# Patient Record
Sex: Female | Born: 1985 | Race: Black or African American | Hispanic: No | Marital: Single | Smoking: Current every day smoker
Health system: Southern US, Community
[De-identification: ages and names within clinical notes are randomized; demographics above are authoritative.]

## PROBLEM LIST (undated history)

## (undated) DIAGNOSIS — A599 Trichomoniasis, unspecified: Secondary | ICD-10-CM

## (undated) DIAGNOSIS — I10 Essential (primary) hypertension: Secondary | ICD-10-CM

## (undated) DIAGNOSIS — I509 Heart failure, unspecified: Secondary | ICD-10-CM

## (undated) HISTORY — DX: Trichomoniasis, unspecified: A59.9

---

## 2005-02-09 ENCOUNTER — Emergency Department (HOSPITAL_COMMUNITY): Admission: EM | Admit: 2005-02-09 | Discharge: 2005-02-10 | Payer: Self-pay | Admitting: Emergency Medicine

## 2006-12-03 ENCOUNTER — Emergency Department (HOSPITAL_COMMUNITY): Admission: EM | Admit: 2006-12-03 | Discharge: 2006-12-03 | Payer: Self-pay | Admitting: Emergency Medicine

## 2009-01-29 ENCOUNTER — Emergency Department (HOSPITAL_COMMUNITY): Admission: EM | Admit: 2009-01-29 | Discharge: 2009-01-29 | Payer: Self-pay | Admitting: Emergency Medicine

## 2012-07-12 ENCOUNTER — Emergency Department (HOSPITAL_COMMUNITY)
Admission: EM | Admit: 2012-07-12 | Discharge: 2012-07-12 | Disposition: A | Discharge status: Home / Self Care | Payer: Self-pay | Attending: Emergency Medicine | Admitting: Emergency Medicine

## 2012-07-12 ENCOUNTER — Encounter (HOSPITAL_COMMUNITY): Payer: Self-pay | Admitting: *Deleted

## 2012-07-12 DIAGNOSIS — L02411 Cutaneous abscess of right axilla: Secondary | ICD-10-CM

## 2012-07-12 DIAGNOSIS — IMO0002 Reserved for concepts with insufficient information to code with codable children: Secondary | ICD-10-CM | POA: Insufficient documentation

## 2012-07-12 DIAGNOSIS — F17200 Nicotine dependence, unspecified, uncomplicated: Secondary | ICD-10-CM | POA: Insufficient documentation

## 2012-07-12 NOTE — ED Provider Notes (Signed)
Medical screening examination/treatment/procedure(s) were performed by non-physician practitioner and as supervising physician I was immediately available for consultation/collaboration.   Celene Kras, MD 07/12/12 [phone]

## 2012-07-12 NOTE — ED Provider Notes (Signed)
History     CSN: [id number]  Arrival date & time 07/12/12  0750   First MD Initiated Contact with Patient 07/12/12 [phone]      Chief Complaint  Patient presents with  . Abscess    (Consider location/radiation/quality/duration/timing/severity/associated sxs/prior treatment) Patient is a 26 y.o. female presenting with abscess. The history is provided by the patient.  Abscess  This is a new problem. The current episode started more than one week ago. The onset was gradual. The problem occurs continuously. The problem has been gradually worsening. Affected Location: R axilla. The problem is moderate. The abscess is characterized by painfulness and swelling. It is unknown what she was exposed to. Pertinent negatives include no fever.  No drainage from area. Hx of abscesses previously requiring drainage.  History reviewed. No pertinent past medical history.  History reviewed. No pertinent past surgical history.  No family history on file.  History  Substance Use Topics  . Smoking status: Current Everyday Smoker -- 0.5 packs/day  . Smokeless tobacco: Not on file  . Alcohol Use: No    OB History    Grav Para Term Preterm Abortions TAB SAB Ect Mult Living                  Review of Systems  Constitutional: Negative for fever and chills.  Musculoskeletal: Negative for myalgias.  Skin: Positive for wound.  Neurological: Negative for numbness.    Allergies  Review of patient's allergies indicates no known allergies.  Home Medications   Current Outpatient Rx  Name Route Sig Dispense Refill  . ASPIRIN 81 MG PO TABS Oral Take 81 mg by mouth daily as needed. pain      BP 170/95  Pulse 103  Temp 97 F (36.1 C) (Oral)  Resp 16  Wt 195 lb (88.451 kg)  SpO2 100%  LMP 07/12/2012  Physical Exam  Nursing note and vitals reviewed. Constitutional: She appears well-developed and well-nourished. No distress.  HENT:  Head: Normocephalic and atraumatic.  Neck: Normal range of  motion.  Cardiovascular: Normal rate.   Pulmonary/Chest: Effort normal.  Musculoskeletal: Normal range of motion.  Neurological: She is alert.  Skin: Skin is warm and dry. She is not diaphoretic.       ~4cm area of fluctuance c/w abscess to R axilla, no drainage or cellulitis, scar tissue present from previous abscesses  Psychiatric: She has a normal mood and affect.    ED Course  Procedures (including critical care time)  INCISION AND DRAINAGE Performed by: Grant Fontana Consent: Verbal consent obtained. Risks and benefits: risks, benefits and alternatives were discussed Type: abscess  Body area: r axilla  Anesthesia: local infiltration  Local anesthetic: lidocaine 2% with epinephrine  Anesthetic total: 5 ml  Complexity: complex Blunt dissection to break up loculations  Drainage: purulent  Drainage amount: copious  Packing material: 1/4 in iodoform gauze  Patient tolerance: Patient tolerated the procedure well with no immediate complications.     Labs Reviewed - No data to display No results found.   1. Abscess of right axilla       MDM  Pt presented with lg abscess to R axilla. No drainage noted. No fever. Nontoxic appearing. No overlying cellulitis. Area was incised and drained which she tolerated well. Packed. Due to size, advised that she return in 2 days for wound recheck. Reasons to return sooner discussed.        Grant Fontana, PA-C 07/12/12 [phone]

## 2012-07-12 NOTE — ED Notes (Signed)
pa at bedside. 

## 2014-12-19 ENCOUNTER — Emergency Department (HOSPITAL_COMMUNITY)
Admission: EM | Admit: 2014-12-19 | Discharge: 2014-12-19 | Disposition: A | Discharge status: Home / Self Care | Payer: Self-pay | Attending: Emergency Medicine | Admitting: Emergency Medicine

## 2014-12-19 ENCOUNTER — Encounter (HOSPITAL_COMMUNITY): Payer: Self-pay

## 2014-12-19 DIAGNOSIS — Z720 Tobacco use: Secondary | ICD-10-CM | POA: Insufficient documentation

## 2014-12-19 DIAGNOSIS — L02411 Cutaneous abscess of right axilla: Secondary | ICD-10-CM | POA: Insufficient documentation

## 2014-12-19 DIAGNOSIS — I10 Essential (primary) hypertension: Secondary | ICD-10-CM | POA: Insufficient documentation

## 2014-12-19 DIAGNOSIS — Z7982 Long term (current) use of aspirin: Secondary | ICD-10-CM | POA: Insufficient documentation

## 2014-12-19 HISTORY — DX: Essential (primary) hypertension: I10

## 2014-12-19 MED ORDER — LIDOCAINE HCL 2 % IJ SOLN
0.0000 mL | Freq: Once | INTRAMUSCULAR | Status: AC | PRN
  Administered 2014-12-19: 1 mg via INTRADERMAL
  Filled 2014-12-19: qty 20

## 2014-12-19 NOTE — ED Notes (Signed)
Attending at bedside.

## 2014-12-19 NOTE — ED Notes (Signed)
Large edematous area to right axilla, tender and hot to touch.

## 2014-12-19 NOTE — ED Provider Notes (Signed)
CSN: [id number]     Arrival date & time 12/19/14  0754 History   First MD Initiated Contact with Patient 12/19/14 0801     Chief Complaint  Patient presents with  . Abscess     (Consider location/radiation/quality/duration/timing/severity/associated sxs/prior Treatment) HPI  Robin Dudley is a 29 y.o. female with PMH of HTN presenting with abscess on her right arm for 1 week. Patient has been trying "[place] ease" gel without much relief. Patient denies any spontaneous drainage, redness. No numbness tingling. No nausea vomiting. No fevers or chills. Patient states she has had this many times before for years. Hasn't had one in the last 23 months. Patient also with history of hypertension on 4 medications. She states she was recently incarcerated and has 6 months worth of medications but no primary care provider. Pt states she has been taking her medications. She denies any headache, slurred speech, visual changes, weakness no hematuria or confusion. No history of DM.   Past Medical History  Diagnosis Date  . Hypertension    History reviewed. No pertinent past surgical history. History reviewed. No pertinent family history. History  Substance Use Topics  . Smoking status: Current Every Day Smoker -- 0.50 packs/day  . Smokeless tobacco: Not on file  . Alcohol Use: No   OB History    No data available     Review of Systems  Constitutional: Negative for fever and chills.  Gastrointestinal: Negative for nausea and vomiting.  Neurological: Negative for numbness and headaches.      Allergies  Review of patient's allergies indicates no known allergies.  Home Medications   Prior to Admission medications   Medication Sig Start Date End Date Taking? Authorizing Provider  aspirin 81 MG tablet Take 81 mg by mouth daily as needed. pain    Historical Provider, MD   BP 182/121 mmHg  Pulse 95  Temp(Src) 98.2 F (36.8 C) (Oral)  Resp 18  SpO2 99%  LMP 12/05/2014 Physical Exam   Constitutional: She appears well-developed and well-nourished. No distress.  HENT:  Head: Normocephalic and atraumatic.  Eyes: Conjunctivae and EOM are normal. Right eye exhibits no discharge. Left eye exhibits no discharge.  Cardiovascular: Normal rate and regular rhythm.   Pulmonary/Chest: No respiratory distress.  Neurological: She is alert. She exhibits normal muscle tone. Coordination normal.  Skin: Skin is warm and dry. She is not diaphoretic.  4 cm x 2 cm fluctuant abscess to right axilla with surrounding induration without surrounding erythema or evidence of cellulitis. No spontaneous discharge.  Nursing note and vitals reviewed.   ED Course  Procedures (including critical care time) Labs Review Labs Reviewed - No data to display  Imaging Review No results found.   EKG Interpretation None      INCISION AND DRAINAGE Performed by: Louann SjogrenVictoria L Normalee Sistare Consent: Verbal consent obtained. Risks and benefits: risks, benefits and alternatives were discussed Type: abscess  Body area: Right axilla  Anesthesia: local infiltration  Incision was made with a scalpel.  Local anesthetic: lidocaine 2% w/o epinephrine  Anesthetic total: 5 ml  Complexity: complex Blunt dissection to break up loculations  Drainage: purulent  Drainage amount: copious  Packing material: 1/2 in iodoform gauze  Patient tolerance: Patient tolerated the procedure well with no immediate complications.     MDM   Final diagnoses:  Abscess of axilla, right  Essential hypertension   Patient with skin abscess amenable to incision and drainage.  Pt tolerated procedure well without immediate complications. Abscess packed with gauze.  Encouraged home warm soaks and flushing.  Mild signs of cellulitis is surrounding skin.  Will d/c to home.  No antibiotic therapy is indicated.  Patient noted to be hypertensive in the emergency department.  No signs of hypertensive urgency.  Discussed with patient the  need for close follow-up and management by a primary care physician. Referral to wellness center. Pt taking HCTZ twice daily and cannot remember the other medications. Pt to take this medication 3 times daily. And take a dose when she gets home.  Discussed return precautions with patient. Discussed all results and patient verbalizes understanding and agrees with plan.     Louann Sjogren, PA-C 12/19/14 0845  Louann Sjogren, PA-C 12/19/14 1914  Mirian Mo, MD 12/20/14 [phone]

## 2014-12-19 NOTE — ED Notes (Signed)
Pt states boil under right arm.  Has hx of same.  Has had x 1 week.  Using heat at home with no relief.

## 2014-12-19 NOTE — Discharge Instructions (Signed)
Return to the emergency room with worsening of symptoms, new symptoms or with symptoms that are concerning, especially pain swelling, severe pain, red streaks from area, fevers, generally feel ill, OR severe worsening of headache, visual or speech changes, weakness in face, arms or legs. Warm soaks 3 times daily. Please read all below information and follow recommendations. Establish care with primary care provider for your hypertension.  Hypertension Hypertension, commonly called high blood pressure, is when the force of blood pumping through your arteries is too strong. Your arteries are the blood vessels that carry blood from your heart throughout your body. A blood pressure reading consists of a higher number over a lower number, such as 110/72. The higher number (systolic) is the pressure inside your arteries when your heart pumps. The lower number (diastolic) is the pressure inside your arteries when your heart relaxes. Ideally you want your blood pressure below 120/80. Hypertension forces your heart to work harder to pump blood. Your arteries may become narrow or stiff. Having hypertension puts you at risk for heart disease, stroke, and other problems.  RISK FACTORS Some risk factors for high blood pressure are controllable. Others are not.  Risk factors you cannot control include:   Race. You may be at higher risk if you are African American.  Age. Risk increases with age.  Gender. Men are at higher risk than women before age 31 years. After age 31, women are at higher risk than men. Risk factors you can control include:  Not getting enough exercise or physical activity.  Being overweight.  Getting too much fat, sugar, calories, or salt in your diet.  Drinking too much alcohol. SIGNS AND SYMPTOMS Hypertension does not usually cause signs or symptoms. Extremely high blood pressure (hypertensive crisis) may cause headache, anxiety, shortness of breath, and nosebleed. DIAGNOSIS  To  check if you have hypertension, your health care provider will measure your blood pressure while you are seated, with your arm held at the level of your heart. It should be measured at least twice using the same arm. Certain conditions can cause a difference in blood pressure between your right and left arms. A blood pressure reading that is higher than normal on one occasion does not mean that you need treatment. If one blood pressure reading is high, ask your health care provider about having it checked again. TREATMENT  Treating high blood pressure includes making lifestyle changes and possibly taking medicine. Living a healthy lifestyle can help lower high blood pressure. You may need to change some of your habits. Lifestyle changes may include:  Following the DASH diet. This diet is high in fruits, vegetables, and whole grains. It is low in salt, red meat, and added sugars.  Getting at least 2 hours of brisk physical activity every week.  Losing weight if necessary.  Not smoking.  Limiting alcoholic beverages.  Learning ways to reduce stress. If lifestyle changes are not enough to get your blood pressure under control, your health care provider may prescribe medicine. You may need to take more than one. Work closely with your health care provider to understand the risks and benefits. HOME CARE INSTRUCTIONS  Have your blood pressure rechecked as directed by your health care provider.   Take medicines only as directed by your health care provider. Follow the directions carefully. Blood pressure medicines must be taken as prescribed. The medicine does not work as well when you skip doses. Skipping doses also puts you at risk for problems.   Do  not smoke.   Monitor your blood pressure at home as directed by your health care provider. SEEK MEDICAL CARE IF:   You think you are having a reaction to medicines taken.  You have recurrent headaches or feel dizzy.  You have swelling in  your ankles.  You have trouble with your vision. SEEK IMMEDIATE MEDICAL CARE IF:  You develop a severe headache or confusion.  You have unusual weakness, numbness, or feel faint.  You have severe chest or abdominal pain.  You vomit repeatedly.  You have trouble breathing. MAKE SURE YOU:   Understand these instructions.  Will watch your condition.  Will get help right away if you are not doing well or get worse. Document Released: 11/28/2005 Document Revised: 04/14/2014 Document Reviewed: 09/20/2013 [facility] Patient Information 2015 [place], [place]. This information is not intended to replace advice given to you by your health care provider. Make sure you discuss any questions you have with your health care provider.    Abscess Care After An abscess (also called a boil or furuncle) is an infected area that contains a collection of pus. Signs and symptoms of an abscess include pain, tenderness, redness, or hardness, or you may feel a moveable soft area under your skin. An abscess can occur anywhere in the body. The infection may spread to surrounding tissues causing cellulitis. A cut (incision) by the surgeon was made over your abscess and the pus was drained out. Gauze may have been packed into the space to provide a drain that will allow the cavity to heal from the inside outwards. The boil may be painful for 5 to 7 days. Most people with a boil do not have high fevers. Your abscess, if seen early, may not have localized, and may not have been lanced. If not, another appointment may be required for this if it does not get better on its own or with medications. HOME CARE INSTRUCTIONS   Only take over-the-counter or prescription medicines for pain, discomfort, or fever as directed by your caregiver.  When you bathe, soak and then remove gauze or iodoform packs at least daily or as directed by your caregiver. You may then wash the wound gently with mild soapy water. Repack with gauze or  do as your caregiver directs. SEEK IMMEDIATE MEDICAL CARE IF:   You develop increased pain, swelling, redness, drainage, or bleeding in the wound site.  You develop signs of generalized infection including muscle aches, chills, fever, or a general ill feeling.  An oral temperature above 102 F (38.9 C) develops, not controlled by medication. See your caregiver for a recheck if you develop any of the symptoms described above. If medications (antibiotics) were prescribed, take them as directed. Document Released: 06/16/2005 Document Revised: 02/20/2012 Document Reviewed: 02/11/2008 [facility] Patient Information 2015 [place], [place]. This information is not intended to replace advice given to you by your health care provider. Make sure you discuss any questions you have with your health care provider.

## 2015-07-30 ENCOUNTER — Encounter (HOSPITAL_COMMUNITY): Payer: Self-pay | Admitting: Emergency Medicine

## 2015-07-30 ENCOUNTER — Emergency Department (HOSPITAL_COMMUNITY)
Admission: EM | Admit: 2015-07-30 | Discharge: 2015-07-30 | Disposition: A | Discharge status: Other Acute Inpatient Hospital | Payer: Self-pay | Attending: Emergency Medicine | Admitting: Emergency Medicine

## 2015-07-30 ENCOUNTER — Inpatient Hospital Stay (HOSPITAL_COMMUNITY)
Admission: AD | Admit: 2015-07-30 | Discharge: 2015-07-30 | Disposition: A | Discharge status: Home / Self Care | Payer: Self-pay | Source: Ambulatory Visit | Attending: Family Medicine | Admitting: Family Medicine

## 2015-07-30 ENCOUNTER — Emergency Department (HOSPITAL_COMMUNITY): Payer: Self-pay

## 2015-07-30 ENCOUNTER — Encounter (HOSPITAL_COMMUNITY): Payer: Self-pay | Admitting: *Deleted

## 2015-07-30 DIAGNOSIS — N739 Female pelvic inflammatory disease, unspecified: Secondary | ICD-10-CM | POA: Insufficient documentation

## 2015-07-30 DIAGNOSIS — R102 Pelvic and perineal pain: Secondary | ICD-10-CM

## 2015-07-30 DIAGNOSIS — I10 Essential (primary) hypertension: Secondary | ICD-10-CM | POA: Insufficient documentation

## 2015-07-30 DIAGNOSIS — N888 Other specified noninflammatory disorders of cervix uteri: Secondary | ICD-10-CM | POA: Insufficient documentation

## 2015-07-30 DIAGNOSIS — F17210 Nicotine dependence, cigarettes, uncomplicated: Secondary | ICD-10-CM | POA: Insufficient documentation

## 2015-07-30 DIAGNOSIS — Z720 Tobacco use: Secondary | ICD-10-CM | POA: Insufficient documentation

## 2015-07-30 DIAGNOSIS — N730 Acute parametritis and pelvic cellulitis: Secondary | ICD-10-CM

## 2015-07-30 DIAGNOSIS — Z3202 Encounter for pregnancy test, result negative: Secondary | ICD-10-CM | POA: Insufficient documentation

## 2015-07-30 LAB — COMPREHENSIVE METABOLIC PANEL
ALBUMIN: 4.1 g/dL (ref 3.5–5.0)
ALK PHOS: 95 U/L (ref 38–126)
ALT: 20 U/L (ref 14–54)
AST: 19 U/L (ref 15–41)
Anion gap: 9 (ref 5–15)
BILIRUBIN TOTAL: 0.3 mg/dL (ref 0.3–1.2)
BUN: 17 mg/dL (ref 6–20)
CALCIUM: 9.2 mg/dL (ref 8.9–10.3)
CO2: 28 mmol/L (ref 22–32)
Chloride: 104 mmol/L (ref 101–111)
Creatinine, Ser: 0.92 mg/dL (ref 0.44–1.00)
GFR calc Af Amer: >60 mL/min (ref >60)
GFR calc non Af Amer: >60 mL/min (ref >60)
GLUCOSE: 106 mg/dL — AB (ref 65–99)
POTASSIUM: 3.4 mmol/L — AB (ref 3.5–5.1)
SODIUM: 141 mmol/L (ref 135–145)
TOTAL PROTEIN: 7.5 g/dL (ref 6.5–8.1)

## 2015-07-30 LAB — CBC WITH DIFFERENTIAL/PLATELET
BASOS ABS: 0 10*3/uL (ref 0.0–0.1)
Basophils Relative: 0 % (ref 0–1)
Eosinophils Absolute: 0.3 10*3/uL (ref 0.0–0.7)
Eosinophils Relative: 2 % (ref 0–5)
HEMATOCRIT: 37.2 % (ref 36.0–46.0)
HEMOGLOBIN: 12.4 g/dL (ref 12.0–15.0)
LYMPHS PCT: 17 % (ref 12–46)
Lymphs Abs: 2.6 10*3/uL (ref 0.7–4.0)
MCH: 27.6 pg (ref 26.0–34.0)
MCHC: 33.3 g/dL (ref 30.0–36.0)
MCV: 82.7 fL (ref 78.0–100.0)
MONOS PCT: 5 % (ref 3–12)
Monocytes Absolute: 0.8 10*3/uL (ref 0.1–1.0)
NEUTROS PCT: 76 % (ref 43–77)
Neutro Abs: 11.3 10*3/uL — ABNORMAL HIGH (ref 1.7–7.7)
Platelets: 234 10*3/uL (ref 150–400)
RBC: 4.5 MIL/uL (ref 3.87–5.11)
RDW: 15.7 % — ABNORMAL HIGH (ref 11.5–15.5)
WBC: 15 10*3/uL — AB (ref 4.0–10.5)

## 2015-07-30 LAB — URINE MICROSCOPIC-ADD ON

## 2015-07-30 LAB — URINALYSIS, ROUTINE W REFLEX MICROSCOPIC
Bilirubin Urine: NEGATIVE
Bilirubin Urine: NEGATIVE
GLUCOSE, UA: NEGATIVE mg/dL
Glucose, UA: NEGATIVE mg/dL
KETONES UR: NEGATIVE mg/dL
Ketones, ur: NEGATIVE mg/dL
LEUKOCYTES UA: NEGATIVE
NITRITE: NEGATIVE
Nitrite: NEGATIVE
PH: 6.5 (ref 5.0–8.0)
PROTEIN: NEGATIVE mg/dL
Protein, ur: NEGATIVE mg/dL
SPECIFIC GRAVITY, URINE: 1.023 (ref 1.005–1.030)
SPECIFIC GRAVITY, URINE: 1.015 (ref 1.005–1.030)
UROBILINOGEN UA: 0.2 mg/dL (ref 0.0–1.0)
Urobilinogen, UA: 0.2 mg/dL (ref 0.0–1.0)
pH: 6.5 (ref 5.0–8.0)

## 2015-07-30 LAB — HIV ANTIBODY (ROUTINE TESTING W REFLEX): HIV Screen 4th Generation wRfx: NONREACTIVE

## 2015-07-30 LAB — WET PREP, GENITAL
CLUE CELLS WET PREP: NONE SEEN
TRICH WET PREP: NONE SEEN
WBC WET PREP: NONE SEEN
Yeast Wet Prep HPF POC: NONE SEEN

## 2015-07-30 LAB — PREGNANCY, URINE: Preg Test, Ur: NEGATIVE

## 2015-07-30 MED ORDER — IBUPROFEN 600 MG PO TABS: 600.0000 mg | ORAL_TABLET | Freq: Four times a day (QID) | ORAL | Status: DC | PRN

## 2015-07-30 MED ORDER — LISINOPRIL 10 MG PO TABS
10.0000 mg | ORAL_TABLET | Freq: Once | ORAL | Status: AC
  Administered 2015-07-30: 10 mg via ORAL
  Filled 2015-07-30: qty 1

## 2015-07-30 MED ORDER — BENZOCAINE 20 % MT SOLN
Freq: Once | OROMUCOSAL | Status: DC | PRN
  Filled 2015-07-30 (×2): qty 57

## 2015-07-30 MED ORDER — HYDROCODONE-ACETAMINOPHEN 5-325 MG PO TABS: 1.0000 | ORAL_TABLET | ORAL | Status: DC | PRN

## 2015-07-30 MED ORDER — HYDROCODONE-ACETAMINOPHEN 5-325 MG PO TABS
1.0000 | ORAL_TABLET | Freq: Once | ORAL | Status: AC
  Administered 2015-07-30: 1 via ORAL
  Filled 2015-07-30: qty 1

## 2015-07-30 MED ORDER — OXYCODONE-ACETAMINOPHEN 5-325 MG PO TABS: 1.0000 | ORAL_TABLET | Freq: Four times a day (QID) | ORAL | Status: DC | PRN

## 2015-07-30 MED ORDER — ONDANSETRON 8 MG PO TBDP
8.0000 mg | ORAL_TABLET | Freq: Once | ORAL | Status: AC
  Administered 2015-07-30: 8 mg via ORAL
  Filled 2015-07-30: qty 1

## 2015-07-30 MED ORDER — HYDROMORPHONE HCL 2 MG/ML IJ SOLN
2.0000 mg | Freq: Once | INTRAMUSCULAR | Status: AC
  Administered 2015-07-30: 2 mg via INTRAVENOUS
  Filled 2015-07-30: qty 1

## 2015-07-30 MED ORDER — HYDROCODONE-ACETAMINOPHEN 5-325 MG PO TABS
1.0000 | ORAL_TABLET | Freq: Once | ORAL | Status: AC
Start: 2015-07-30 — End: 2015-07-30
  Administered 2015-07-30: 1 via ORAL
  Filled 2015-07-30: qty 1

## 2015-07-30 MED ORDER — CEFTRIAXONE SODIUM 250 MG IJ SOLR
250.0000 mg | Freq: Once | INTRAMUSCULAR | Status: AC
  Administered 2015-07-30: 250 mg via INTRAMUSCULAR
  Filled 2015-07-30: qty 250

## 2015-07-30 MED ORDER — LISINOPRIL 10 MG PO TABS: 10.0000 mg | ORAL_TABLET | Freq: Every day | ORAL | Status: DC

## 2015-07-30 MED ORDER — IOHEXOL 300 MG/ML  SOLN
100.0000 mL | Freq: Once | INTRAMUSCULAR | Status: AC | PRN
  Administered 2015-07-30: 100 mL via INTRAVENOUS

## 2015-07-30 MED ORDER — IOHEXOL 300 MG/ML  SOLN
50.0000 mL | Freq: Once | INTRAMUSCULAR | Status: AC | PRN
  Administered 2015-07-30: 50 mL via ORAL

## 2015-07-30 MED ORDER — PROMETHAZINE HCL 25 MG PO TABS: 25.0000 mg | ORAL_TABLET | Freq: Four times a day (QID) | ORAL | Status: DC | PRN

## 2015-07-30 MED ORDER — AZITHROMYCIN 250 MG PO TABS
1000.0000 mg | ORAL_TABLET | Freq: Once | ORAL | Status: AC
Start: 2015-07-30 — End: 2015-07-30
  Administered 2015-07-30: 1000 mg via ORAL
  Filled 2015-07-30: qty 4

## 2015-07-30 MED ORDER — AZITHROMYCIN 500 MG PO TABS: 1000.0000 mg | ORAL_TABLET | Freq: Once | ORAL | Status: DC

## 2015-07-30 NOTE — ED Provider Notes (Signed)
CSN: [id number]     Arrival date & time 07/30/15  0243 History  This chart was scribed for Loren Racer, MD by Doreatha Martin, ED Scribe. This patient was seen in room WA16/WA16 and the patient's care was started at 4:02 AM.     Chief Complaint  Patient presents with  . Vaginal Pain  . Back Pain   The history is provided by the patient. No language interpreter was used.   HPI Comments: Robin Dudley is a 29 y.o. female with Hx of HTN who presents to the Emergency Department complaining of moderate, gradual onset vaginal pain onset this afternoon. She states associated BLE pain, constant lower back pain that wraps around to the lower abdomen. She states that her pain was temporarily relieved by a warm bath. No Hx of similar Sx. LNMP 8/11-8/14. She states her periods have been regular. She is not followed by an OB/GYN. She denies any known trauma or injury with the onset of her symptoms. She also denies vaginal bleeding, vaginal discharge, nausea, vomiting, fever or chills.    Past Medical History  Diagnosis Date  . Hypertension    History reviewed. No pertinent past surgical history. No family history on file. Social History  Substance Use Topics  . Smoking status: Current Every Day Smoker -- 0.50 packs/day    Types: Cigarettes  . Smokeless tobacco: None  . Alcohol Use: No   OB History    No data available     Review of Systems  Constitutional: Negative for fever and chills.  Respiratory: Negative for shortness of breath.   Cardiovascular: Negative for chest pain.  Gastrointestinal: Positive for abdominal pain. Negative for nausea, vomiting, diarrhea and constipation.  Genitourinary: Positive for pelvic pain. Negative for dysuria, flank pain, vaginal bleeding, vaginal discharge and difficulty urinating.  Musculoskeletal: Positive for back pain. Negative for myalgias, neck pain and neck stiffness.  Neurological: Negative for dizziness, weakness, light-headedness, numbness and  headaches.  All other systems reviewed and are negative.   Allergies  Review of patient's allergies indicates no known allergies.  Home Medications   Prior to Admission medications   Medication Sig Start Date End Date Taking? Authorizing Provider  aspirin-acetaminophen-caffeine (EXCEDRIN MIGRAINE) [phone] MG per tablet Take 2 tablets by mouth every 6 (six) hours as needed for headache.   Yes Historical Provider, MD  HYDROcodone-acetaminophen (NORCO) 5-325 MG per tablet Take 1-2 tablets by mouth every 4 (four) hours as needed. 07/30/15   Loren Racer, MD  ibuprofen (ADVIL,MOTRIN) 600 MG tablet Take 1 tablet (600 mg total) by mouth every 6 (six) hours as needed. 07/30/15   Loren Racer, MD   BP 176/101 mmHg  Pulse 100  Temp(Src) 99.8 F (37.7 C) (Oral)  Resp 16  Ht  (1.651 m)  Wt 220 lb (99.791 kg)  BMI 36.61 kg/m2  SpO2 98%  LMP 07/25/2015 Physical Exam  Constitutional: She is oriented to person, place, and time. She appears well-developed and well-nourished. No distress.  HENT:  Head: Normocephalic and atraumatic.  Mouth/Throat: Oropharynx is clear and moist.  Eyes: EOM are normal. Pupils are equal, round, and reactive to light.  Neck: Normal range of motion. Neck supple.  Cardiovascular: Normal rate and regular rhythm.   Pulmonary/Chest: Effort normal and breath sounds normal. No respiratory distress. She has no wheezes. She has no rales. She exhibits no tenderness.  Abdominal: Soft. Bowel sounds are normal. She exhibits no mass. There is tenderness (Right pelvic tenderness with palpation.). There is no rebound and  no guarding.  Genitourinary:  The cervical motion tenderness the speculum exam is painful. Right greater than left adnexal and lower quadrant tenderness. Questionable anterior vaginal mass which is very tender to palpation.  Musculoskeletal: Normal range of motion. She exhibits tenderness. She exhibits no edema.  Patient has right-sided low back  tenderness with palpation. No midline tenderness. No definite CVA tenderness bilaterally.  Neurological: She is alert and oriented to person, place, and time.  Skin: Skin is warm and dry. No rash noted. No erythema.  Psychiatric: She has a normal mood and affect. Her behavior is normal.  Nursing note and vitals reviewed.   ED Course  Procedures (including critical care time) DIAGNOSTIC STUDIES: Oxygen Saturation is 100% on RA, normal by my interpretation.    COORDINATION OF CARE: 4:06 AM Discussed treatment plan with pt at bedside and pt agreed to plan.   Labs Review Labs Reviewed  CBC WITH DIFFERENTIAL/PLATELET - Abnormal; Notable for the following:    WBC 15.0 (*)    RDW 15.7 (*)    Neutro Abs 11.3 (*)    All other components within normal limits  COMPREHENSIVE METABOLIC PANEL - Abnormal; Notable for the following:    Potassium 3.4 (*)    Glucose, Bld 106 (*)    All other components within normal limits  URINALYSIS, ROUTINE W REFLEX MICROSCOPIC (NOT AT [facility]) - Abnormal; Notable for the following:    Hgb urine dipstick MODERATE (*)    Leukocytes, UA TRACE (*)    All other components within normal limits  WET PREP, GENITAL  PREGNANCY, URINE  URINE MICROSCOPIC-ADD ON  HIV ANTIBODY (ROUTINE TESTING)  GC/CHLAMYDIA PROBE AMP ([place]) NOT AT [facility]    Imaging Review US Pelvis Complete  07/30/2015   CLINICAL DATA:  Right adnexal tenderness  EXAM: TRANSABDOMINAL ULTRASOUND OF PELVIS  TECHNIQUE: Transabdominal ultrasound examination of the pelvis was performed including evaluation of the uterus, ovaries, adnexal regions, and pelvic cul-de-sac.  COMPARISON:  None.  FINDINGS: Uterus  Measurements: 9 x 3 x 5 cm. No fibroids or other mass visualized.  Endometrium  Not well delineated transabdominally; patient could not tolerate transvaginal evaluation. No focal abnormality visualized.  Right ovary  Measurements: 34 x 22 x 22 mm. Normal appearance/no adnexal mass.  Left ovary   Measurements: 33 x 22 x 26 mm. Normal appearance/no adnexal mass.  Other findings:  No free fluid  IMPRESSION: Normal transabdominal pelvic ultrasound.   Electronically Signed   By: Marnee Spring M.D.   On: 07/30/2015 05:42   I have personally reviewed and evaluated these images and lab results as part of my medical decision-making.   EKG Interpretation None      MDM   Final diagnoses:  Cyst, cervix    I personally performed the services described in this documentation, which was scribed in my presence. The recorded information has been reviewed and is accurate.  Reviewed findings with OB/GYN on call and recommended transfer to [facility] for further evaluation. Relayed to patient which she will transport by private vehicle.  Loren Racer, MD 07/30/15 [phone]

## 2015-07-30 NOTE — Progress Notes (Signed)
Consent explained by Dorathy Kinsman CNM, time out performed, consent signed.

## 2015-07-30 NOTE — Discharge Instructions (Signed)
Go to the MAU at [facility] for further evaluation of your cervical cyst.

## 2015-07-30 NOTE — MAU Note (Signed)
Pt sent from [facility] with dx of ovarian cyst for further eval.

## 2015-07-30 NOTE — Discharge Instructions (Signed)
Pelvic Inflammatory Disease °Pelvic inflammatory disease (PID) refers to an infection in some or all of the female organs. The infection can be in the uterus, ovaries, fallopian tubes, or the surrounding tissues in the pelvis. PID can cause abdominal or pelvic pain that comes on suddenly (acute pelvic pain). PID is a serious infection because it can lead to lasting (chronic) pelvic pain or the inability to have children (infertile).  °CAUSES  °The infection is often caused by the normal bacteria found in the vaginal tissues. PID may also be caused by an infection that is spread during sexual contact. PID can also occur following:  °· The birth of a baby.   °· A miscarriage.   °· An abortion.   °· Major pelvic surgery.   °· The use of an intrauterine device (IUD).   °· A sexual assault.   °RISK FACTORS °Certain factors can put a person at higher risk for PID, such as: °· Being younger than 25 years. °· Being sexually active at a young age. °· Using nonbarrier contraception. °· Having multiple sexual partners. °· Having sex with someone who has symptoms of a genital infection. °· Using oral contraception. °Other times, certain behaviors can increase the possibility of getting PID, such as: °· Having sex during your period. °· Using a vaginal douche. °· Having an intrauterine device (IUD) in place. °SYMPTOMS  °· Abdominal or pelvic pain.   °· Fever.   °· Chills.   °· Abnormal vaginal discharge. °· Abnormal uterine bleeding.   °· Unusual pain shortly after finishing your period. °DIAGNOSIS  °Your caregiver will choose some of the following methods to make a diagnosis, such as:  °· Performing a physical exam and history. A pelvic exam typically reveals a very tender uterus and surrounding pelvis.   °· Ordering laboratory tests including a pregnancy test, blood tests, and urine test.  °· Ordering cultures of the vagina and cervix to check for a sexually transmitted infection (STI). °· Performing an ultrasound.    °· Performing a laparoscopic procedure to look inside the pelvis.   °TREATMENT  °· Antibiotic medicines may be prescribed and taken by mouth.   °· Sexual partners may be treated when the infection is caused by a sexually transmitted disease (STD).   °· Hospitalization may be needed to give antibiotics intravenously. °· Surgery may be needed, but this is rare. °It may take weeks until you are completely well. If you are diagnosed with PID, you should also be checked for human immunodeficiency virus (HIV).   °HOME CARE INSTRUCTIONS  °· If given, take your antibiotics as directed. Finish the medicine even if you start to feel better.   °· Only take over-the-counter or prescription medicines for pain, discomfort, or fever as directed by your caregiver.   °· Do not have sexual intercourse until treatment is completed or as directed by your caregiver. If PID is confirmed, your recent sexual partner(s) will need treatment.   °· Keep your follow-up appointments. °SEEK MEDICAL CARE IF:  °· You have increased or abnormal vaginal discharge.   °· You need prescription medicine for your pain.   °· You vomit.   °· You cannot take your medicines.   °· Your partner has an STD.   °SEEK IMMEDIATE MEDICAL CARE IF:  °· You have a fever.   °· You have increased abdominal or pelvic pain.   °· You have chills.   °· You have pain when you urinate.   °· You are not better after 72 hours following treatment.   °MAKE SURE YOU:  °· Understand these instructions. °· Will watch your condition. °· Will get help right away if you are not doing well or get worse. °  Document Released: 11/28/2005 Document Revised: 03/25/2013 Document Reviewed: 11/24/2011 °ExitCare® Patient Information ©2015 [employer]. This information is not intended to replace advice given to you by your health care provider. Make sure you discuss any questions you have with your health care provider. ° °

## 2015-07-30 NOTE — ED Notes (Signed)
Pt c/o vaginal pain, lower back pain and lower abdominal pain x1 hour. Denies urinary frequency/discharge/burning with urination.

## 2015-07-30 NOTE — MAU Provider Note (Signed)
Chief Complaint: Ovarian Cyst   First Provider Initiated Contact with Patient 07/30/15 1032     SUBJECTIVE HPI: Robin Dudley is a 29 y.o. G1P0010 non-pregnant who was transferred to [facility] from [facility] ED for further eval and management of pelvic pain w/ leukocytosis and possible 2.5 cm Nabothian cyst per CT. CT otherwise normal. Pelvic US normal, but pt could not tolerate TV US. No cystic structures noted on [place] report. Wet, Prep, CBC, CMET essentially normal except WBC 15. UA mod Hgb, Tr leaks. Will send culture. Pt given Norco at ED w/ minimal relief.   Location: pelvic/vaginal Quality: pressure Severity: 10/10 on pain scale Duration: 24 hours Context: none Timing: constant Modifying factors: worse w/ mvmt. Minimal improvement w/ Norco. Associated signs and symptoms: Neg for fever, chills, N/V/D/C or urinary complaints.    No past medical history on file. OB History  Gravida Para Term Preterm AB SAB TAB Ectopic Multiple Living  1    1 1         # Outcome Date GA Lbr Len/2nd Weight Sex Delivery Anes PTL Lv  1 SAB              No surgical Hx.   Social History   Social History  . Marital Status: Single    Spouse Name: N/A  . Number of Children: N/A  . Years of Education: N/A   Occupational History  . Not on file.   Social History Main Topics  . Smoking status: Current Every Day Smoker -- 0.50 packs/day    Types: Cigarettes  . Smokeless tobacco: Not on file  . Alcohol Use: No  . Drug Use: No  . Sexual Activity: No   Other Topics Concern  . Not on file   Social History Narrative   No current facility-administered medications on file prior to encounter.   Current Outpatient Prescriptions on File Prior to Encounter  Medication Sig Dispense Refill  . HYDROcodone-acetaminophen (NORCO) 5-325 MG per tablet Take 1-2 tablets by mouth every 4 (four) hours as needed. 20 tablet 0  . ibuprofen (ADVIL,MOTRIN) 600 MG tablet Take 1 tablet (600 mg total) by  mouth every 6 (six) hours as needed. 30 tablet 0   No Known Allergies  I have reviewed the past Medical Hx, Surgical Hx, Social Hx, Allergies and Medications.   Review of Systems  Constitutional: Negative for fever, chills and appetite change.  Respiratory: Negative for chest tightness and shortness of breath.   Cardiovascular: Negative for chest pain.  Gastrointestinal: Negative for nausea, vomiting, abdominal pain, diarrhea, constipation, blood in stool and abdominal distention.  Genitourinary: Positive for vaginal pain and pelvic pain. Negative for dysuria, urgency, frequency, hematuria, flank pain, vaginal bleeding, vaginal discharge and menstrual problem.  Musculoskeletal: Negative for back pain.  Neurological: Negative for headaches.    OBJECTIVE Patient Vitals for the past 24 hrs:  BP Temp Pulse Height Weight  07/30/15 1441 170/82 mmHg - 109 - -  07/30/15 1327 (!) 186/105 mmHg - - - -  07/30/15 1005 (!) 163/118 mmHg 98.8 F (37.1 C) 110 5\' 5"  (1.651 m) 235 lb (106.595 kg)   Constitutional: Well-developed, well-nourished female in moderate distress, tearful, curled up on side.  Cardiovascular: mild tachycardia. Reg Rhythm Respiratory: normal rate and effort.  GI: Abd soft, mild SP tenderness. Pos BS x 4 MS: Extremities nontender, no edema, normal ROM Neurologic: Alert and oriented x 4.  GU: Neg CVAT.  SPECULUM EXAM: NEFG except for possible healing folliculitis inferior to  left labia majora, physiologic discharge, no blood noted, cervix only 90% visualized despite trying four different speculums (cervix extremely far back and pt intolerant of exam). No nabothian cysts or other lesions seen. Cervix pink and smooth, non-friable. No mucopurulent discharge.       BIMANUAL: cervix closed; uterus normal size, mild right adnexal tenderness . No mass. No left adnexal tenderness or mass.  Significant CMT. Anterior vaginal wall very tender, compressible 2x3 cm mass palpated. No erythema,  drainage or bleeding from mass or urethra. ?Urethral diverticula vs vaginal wall cyst/abscess.   LAB RESULTS Results for orders placed or performed during the hospital encounter of 07/30/15 (from the past 24 hour(s))  Urinalysis, Routine w reflex microscopic (not at [facility])     Status: Abnormal   Collection Time: 07/30/15 12:55 PM  Result Value Ref Range   Color, Urine YELLOW YELLOW   APPearance CLEAR CLEAR   Specific Gravity, Urine 1.015 1.005 - 1.030   pH 6.5 5.0 - 8.0   Glucose, UA NEGATIVE NEGATIVE mg/dL   Hgb urine dipstick TRACE (A) NEGATIVE   Bilirubin Urine NEGATIVE NEGATIVE   Ketones, ur NEGATIVE NEGATIVE mg/dL   Protein, ur NEGATIVE NEGATIVE mg/dL   Urobilinogen, UA 0.2 0.0 - 1.0 mg/dL   Nitrite NEGATIVE NEGATIVE   Leukocytes, UA NEGATIVE NEGATIVE  Urine microscopic-add on     Status: Abnormal   Collection Time: 07/30/15 12:55 PM  Result Value Ref Range   Squamous Epithelial / LPF FEW (A) RARE   WBC, UA 0-2 <3 WBC/hpf   RBC / HPF 0-2 <3 RBC/hpf   Bacteria, UA RARE RARE    IMAGING US Pelvis Complete  07/30/2015   CLINICAL DATA:  Right adnexal tenderness  EXAM: TRANSABDOMINAL ULTRASOUND OF PELVIS  TECHNIQUE: Transabdominal ultrasound examination of the pelvis was performed including evaluation of the uterus, ovaries, adnexal regions, and pelvic cul-de-sac.  COMPARISON:  None.  FINDINGS: Uterus  Measurements: 9 x 3 x 5 cm. No fibroids or other mass visualized.  Endometrium  Not well delineated transabdominally; patient could not tolerate transvaginal evaluation. No focal abnormality visualized.  Right ovary  Measurements: 34 x 22 x 22 mm. Normal appearance/no adnexal mass.  Left ovary  Measurements: 33 x 22 x 26 mm. Normal appearance/no adnexal mass.  Other findings:  No free fluid  IMPRESSION: Normal transabdominal pelvic ultrasound.   Electronically Signed   By: Marnee Spring M.D.   On: 07/30/2015 05:42   Ct Abdomen Pelvis W Contrast  07/30/2015   CLINICAL DATA:  Moderate  vaginal and low back pain. Elevated white blood cell count. Initial encounter.  EXAM: CT ABDOMEN AND PELVIS WITH CONTRAST  TECHNIQUE: Multidetector CT imaging of the abdomen and pelvis was performed using the standard protocol following bolus administration of intravenous contrast.  CONTRAST:  100 mL OMNIPAQUE IOHEXOL 300 MG/ML  SOLN  COMPARISON:  Pelvic ultrasound 07/30/2015.  FINDINGS: The lung bases are clear. No pleural pericardial effusion. Heart size is upper normal.  The gallbladder, liver, spleen, adrenal glands, pancreas and kidneys are unremarkable with a bifid right renal collecting system incidentally noted.  Uterus, adnexa and urinary bladder appear normal. Several cysts are identified in the cervix and are presumably nabothian cysts. The largest is on the left and measures 2.5 cm AP by 1.1 cm transverse. The stomach, small and large bowel and appendix appear normal. A few small retroperitoneal lymph nodes are identified but no pathologic lymphadenopathy by CT size criteria is seen.  No lytic or sclerotic bony lesions  identified.  IMPRESSION: No acute abnormality or finding to explain the patient's symptoms.  Small cysts in the cervix most consistent with nabothian cysts are noted.   Electronically Signed   By: Drusilla Kanner M.D.   On: 07/30/2015 07:53    MAU COURSE All imaging and lab results from [facility] ED reviewed with Dr. Shawnie Pons. Will premedicate pt w/ Dilaudid and attempt to drain nabothian cysts for pain relief. UA mod Hgb, Tr leaks. Will send culture. Due to significant CMT and and leukocytosis will Tx for PID w/ Rocephin and Azithromycin.  Pt's pain improved significantly. Able to tolerate spec exam, but no nabothian cysts seen.   BP elevated. Lisinopril given.   MDM -29 year-old non-pregnant female w/ one day Hx pelvic/vaginal pain. Possible PID vs cervical or vaginal cyst pain. Is a candidate for OP Tx of presumed PID. No visible nabothian cyst, so CNM unable to drain. Per Dr Penne Lash  will see how pt responds to ABX Tx and have her F/U OP transvaginal US and appt at [facility] for further eval, Tx of cyst.  -BP elevated. Improved slightly w/ pain management, Lisinopril (not full effect yet.)  No S/S end-organ injury. (CP, SOB, HA.) Pt initially denies Hx HTN, but upon review of previous ED visit in the past several years pt has had elevated BP similar to today's values. Acknowledges Hx HTN, but states she has never be Tx'd. Will start Lisinopril in MAU, but discussed need for F/U w/ PCP (doesn't have one. No Insurance). Referred to [facility]. Will not refer back to ED for HTN since they evaluated her initially and, in abscence of evidence of end-organ damage, rapid decrease in BP is not recommended (Up-to-Date)  ASSESSMENT 1. PID (acute pelvic inflammatory disease)   2. Nabothian cyst   3. Essential hypertension    PLAN Discharge home in stable condition. HTN Precautions Second dose Azithromycin in 1 week. In-basket message sent to [facility] for F/U appt. OP TV US. Follow-up Information    Follow up with [facility] In 2 weeks.   Specialty:  Obstetrics and Gynecology   Contact information:   [address] [place] [address] [phone]      Follow up with THE [facility] [place] ULTRASOUND On 08/04/2015.   Specialty:  Radiology   Why:  will call you to schedule ultrasound   Contact information:   [address] [id number] mc [place] [address] [phone]      Follow up with THE [facility] [place] MATERNITY ADMISSIONS.   Why:  As needed in emergencies   Contact information:   [address] [phone] mc [place] [address] [phone]      Schedule an appointment as soon as possible for a visit with [facility].   Specialty:  Family Medicine   Contact information:   [address] [id number] mc [place] [address] [phone]        Medication List    STOP taking these medications        HYDROcodone-acetaminophen 5-325 MG per tablet  Commonly known as:  NORCO      TAKE these medications        azithromycin 500 MG tablet  Commonly known as:  ZITHROMAX  Take 2 tablets (1,000 mg total) by mouth once.  Start taking on:  08/06/2015     EXCEDRIN EXTRA STRENGTH PO  Take 2 tablets by mouth once.     ibuprofen 600 MG tablet  Commonly known as:  ADVIL,MOTRIN  Take 1 tablet (600 mg total) by mouth every 6 (six) hours as needed.     lisinopril 10 MG tablet  Commonly known as:  PRINIVIL,ZESTRIL  Take 1 tablet (10 mg total) by mouth daily.     oxyCODONE-acetaminophen 5-325 MG per tablet  Commonly known as:  PERCOCET/ROXICET  Take 1-2 tablets by mouth every 6 (six) hours as needed.     promethazine 25 MG tablet  Commonly known as:  PHENERGAN  Take 1 tablet (25 mg total) by mouth every 6 (six) hours as needed.       [place], CNM 07/30/2015  2:52 PM

## 2015-07-31 LAB — GC/CHLAMYDIA PROBE AMP (~~LOC~~) NOT AT ARMC
CHLAMYDIA, DNA PROBE: NEGATIVE
NEISSERIA GONORRHEA: NEGATIVE

## 2015-08-02 LAB — URINE CULTURE: SPECIAL REQUESTS: NORMAL

## 2015-08-05 ENCOUNTER — Ambulatory Visit (HOSPITAL_COMMUNITY): Payer: Self-pay

## 2015-08-12 ENCOUNTER — Ambulatory Visit (HOSPITAL_COMMUNITY): Admission: RE | Admit: 2015-08-12 | Payer: Self-pay | Source: Ambulatory Visit

## 2015-08-12 ENCOUNTER — Encounter: Payer: Self-pay | Admitting: Obstetrics & Gynecology

## 2019-10-04 ENCOUNTER — Emergency Department (HOSPITAL_COMMUNITY)
Admission: EM | Admit: 2019-10-04 | Discharge: 2019-10-04 | Disposition: A | Discharge status: Home / Self Care | Payer: Self-pay | Attending: Emergency Medicine | Admitting: Emergency Medicine

## 2019-10-04 ENCOUNTER — Encounter (HOSPITAL_COMMUNITY): Payer: Self-pay | Admitting: Emergency Medicine

## 2019-10-04 ENCOUNTER — Telehealth (HOSPITAL_COMMUNITY): Payer: Self-pay | Admitting: Emergency Medicine

## 2019-10-04 ENCOUNTER — Other Ambulatory Visit: Payer: Self-pay

## 2019-10-04 DIAGNOSIS — Z79899 Other long term (current) drug therapy: Secondary | ICD-10-CM | POA: Insufficient documentation

## 2019-10-04 DIAGNOSIS — M79604 Pain in right leg: Secondary | ICD-10-CM | POA: Insufficient documentation

## 2019-10-04 DIAGNOSIS — F17210 Nicotine dependence, cigarettes, uncomplicated: Secondary | ICD-10-CM | POA: Insufficient documentation

## 2019-10-04 DIAGNOSIS — I10 Essential (primary) hypertension: Secondary | ICD-10-CM | POA: Insufficient documentation

## 2019-10-04 DIAGNOSIS — M79605 Pain in left leg: Secondary | ICD-10-CM | POA: Insufficient documentation

## 2019-10-04 LAB — CBC WITH DIFFERENTIAL/PLATELET
Abs Immature Granulocytes: 0.09 10*3/uL — ABNORMAL HIGH (ref 0.00–0.07)
Basophils Absolute: 0.1 10*3/uL (ref 0.0–0.1)
Basophils Relative: 1 %
Eosinophils Absolute: 0.3 10*3/uL (ref 0.0–0.5)
Eosinophils Relative: 3 %
HCT: 37.5 % (ref 36.0–46.0)
Hemoglobin: 12 g/dL (ref 12.0–15.0)
Immature Granulocytes: 1 %
Lymphocytes Relative: 21 %
Lymphs Abs: 2.2 10*3/uL (ref 0.7–4.0)
MCH: 26.4 pg (ref 26.0–34.0)
MCHC: 32 g/dL (ref 30.0–36.0)
MCV: 82.6 fL (ref 80.0–100.0)
Monocytes Absolute: 0.8 10*3/uL (ref 0.1–1.0)
Monocytes Relative: 8 %
Neutro Abs: 7.1 10*3/uL (ref 1.7–7.7)
Neutrophils Relative %: 66 %
Platelets: 281 10*3/uL (ref 150–400)
RBC: 4.54 MIL/uL (ref 3.87–5.11)
RDW: 16.6 % — ABNORMAL HIGH (ref 11.5–15.5)
WBC: 10.6 10*3/uL — ABNORMAL HIGH (ref 4.0–10.5)
nRBC: 0 % (ref 0.0–0.2)

## 2019-10-04 LAB — BASIC METABOLIC PANEL
Anion gap: 9 (ref 5–15)
BUN: 16 mg/dL (ref 6–20)
CO2: 23 mmol/L (ref 22–32)
Calcium: 8.9 mg/dL (ref 8.9–10.3)
Chloride: 106 mmol/L (ref 98–111)
Creatinine, Ser: 1.1 mg/dL — ABNORMAL HIGH (ref 0.44–1.00)
GFR calc Af Amer: >60 mL/min (ref >60)
GFR calc non Af Amer: >60 mL/min (ref >60)
Glucose, Bld: 91 mg/dL (ref 70–99)
Potassium: 4.2 mmol/L (ref 3.5–5.1)
Sodium: 138 mmol/L (ref 135–145)

## 2019-10-04 MED ORDER — LISINOPRIL 10 MG PO TABS: 10.0000 mg | ORAL_TABLET | Freq: Every day | ORAL | 1 refills | Status: DC

## 2019-10-04 MED ORDER — LISINOPRIL 10 MG PO TABS
10.0000 mg | ORAL_TABLET | Freq: Once | ORAL | Status: AC
  Administered 2019-10-04: 10 mg via ORAL
  Filled 2019-10-04: qty 1

## 2019-10-04 NOTE — ED Triage Notes (Signed)
Pt reports "burning charlie horse" pain in bilateral legs that goes from thighs down to her ankles. States pain started last night at work, not aware of any specific events that precipitated pain.

## 2019-10-04 NOTE — ED Notes (Signed)
Patient verbalized understanding of dc instructions, vss, ambulatory with nad.   

## 2019-10-04 NOTE — ED Provider Notes (Signed)
[place] EMERGENCY DEPARTMENT Provider Note   CSN: [id number] Arrival date & time: 10/04/19  4742     History   Chief Complaint Chief Complaint  Patient presents with  . Leg Pain    HPI Robin Dudley is a 33 y.o. female.     33 year old female presents with complaint of charley horse type pain in both of her lower extremities.  Patient states that she worked yesterday, came home and slept for little but got up to go to her second job and when she stood up she had severe cramping pain in both of her legs.  Patient denies any recent illnesses, fevers, vomiting or diarrhea.  Patient states that she does not drink much water throughout the day.  Patient denies any pain at this time sitting in the chair.  Patient has a history of hypertension, does not take any medication currently as she cannot afford the medication cannot afford to see her provider to get her prescriptions.  Patient denies headache, chest pain, visual disturbance or any other complaints or concerns.     History reviewed. No pertinent past medical history.  There are no active problems to display for this patient.   History reviewed. No pertinent surgical history.   OB History    Gravida  1   Para      Term      Preterm      AB  1   Living        SAB  1   TAB      Ectopic      Multiple      Live Births               Home Medications    Prior to Admission medications   Medication Sig Start Date End Date Taking? Authorizing Provider  Aspirin-Acetaminophen-Caffeine (EXCEDRIN EXTRA STRENGTH PO) Take 2 tablets by mouth once.    [provider]  azithromycin (ZITHROMAX) 500 MG tablet Take 2 tablets (1,000 mg total) by mouth once. 08/06/15   Tamala Julian, [place], CNM  ibuprofen (ADVIL,MOTRIN) 600 MG tablet Take 1 tablet (600 mg total) by mouth every 6 (six) hours as needed. 07/30/15   Julianne Rice, MD  lisinopril (ZESTRIL) 10 MG tablet Take 1 tablet (10 mg total)  by mouth daily. 10/04/19   Tacy Learn, PA-C  oxyCODONE-acetaminophen (PERCOCET/ROXICET) 5-325 MG per tablet Take 1-2 tablets by mouth every 6 (six) hours as needed. 07/30/15   Tamala Julian, [place], CNM  promethazine (PHENERGAN) 25 MG tablet Take 1 tablet (25 mg total) by mouth every 6 (six) hours as needed. 07/30/15   Manya Silvas, CNM    Family History No family history on file.  Social History Social History   Tobacco Use  . Smoking status: Current Every Day Smoker    Packs/day: 0.50    Types: Cigarettes  Substance Use Topics  . Alcohol use: No  . Drug use: No     Allergies   Patient has no known allergies.   Review of Systems Review of Systems  Constitutional: Negative for fever.  Eyes: Negative for visual disturbance.  Cardiovascular: Negative for chest pain.  Gastrointestinal: Negative for abdominal pain.  Musculoskeletal: Positive for myalgias. Negative for arthralgias, back pain, gait problem and joint swelling.  Skin: Negative for color change, rash and wound.  Allergic/Immunologic: Negative for immunocompromised state.  Neurological: Negative for weakness, numbness and headaches.  Psychiatric/Behavioral: Negative for confusion.  All other systems reviewed and are negative.  Physical Exam Updated Vital Signs BP (!) 176/102   Pulse 73   Temp 98.5 F (36.9 C)   Resp 16   SpO2 97%   Physical Exam Vitals signs and nursing note reviewed.  Constitutional:      General: She is not in acute distress.    Appearance: She is well-developed. She is not diaphoretic.  HENT:     Head: Normocephalic and atraumatic.  Cardiovascular:     Rate and Rhythm: Normal rate and regular rhythm.     Pulses: Normal pulses.     Heart sounds: Normal heart sounds.  Pulmonary:     Effort: Pulmonary effort is normal.     Breath sounds: Normal breath sounds.  Abdominal:     Palpations: Abdomen is soft.     Tenderness: There is no abdominal tenderness.  Musculoskeletal:  Normal range of motion.        General: No swelling, tenderness, deformity or signs of injury.     Right lower leg: No edema.     Left lower leg: No edema.  Skin:    General: Skin is warm and dry.     Findings: No erythema or rash.  Neurological:     Mental Status: She is alert and oriented to person, place, and time.  Psychiatric:        Behavior: Behavior normal.      ED Treatments / Results  Labs (all labs ordered are listed, but only abnormal results are displayed) Labs Reviewed  BASIC METABOLIC PANEL - Abnormal; Notable for the following components:      Result Value   Creatinine, Ser 1.10 (*)    All other components within normal limits  CBC WITH DIFFERENTIAL/PLATELET - Abnormal; Notable for the following components:   WBC 10.6 (*)    RDW 16.6 (*)    Abs Immature Granulocytes 0.09 (*)    All other components within normal limits    EKG None  Radiology No results found.  Procedures Procedures (including critical care time)  Medications Ordered in ED Medications  lisinopril (ZESTRIL) tablet 10 mg (10 mg Oral Given 10/04/19 1122)     Initial Impression / Assessment and Plan / ED Course  I have reviewed the triage vital signs and the nursing notes.  Pertinent labs & imaging results that were available during my care of the patient were reviewed by me and considered in my medical decision making (see chart for details).  Clinical Course as of Oct 03 1241  Fri Oct 04, 2019  [id number] 33 year old female with complaint of charley horse feeling in both of her legs today upon standing, has resolved as of this time.  On exam patient has symmetric sensation, right leg strength, reflexes.  There is no lower extremity edema.,  No back pain or abdominal pain.  Plan is to check lab work to evaluate electrolyte status, no acute electrolyte abnormalities.  Patient's creatinine has a slight increase to 1.10.  Discussed patient's elevated blood pressure today.  She will be given a  prescription for her lisinopril with a refill and advised to follow-up with [facility] to establish care and further manage her blood pressure.   [LM]    Clinical Course User Index [LM] Jeannie FendMurphy, Cherlynn Popiel A, PA-C      Final Clinical Impressions(s) / ED Diagnoses   Final diagnoses:  Pain in both lower extremities  Hypertension, unspecified type    ED Discharge Orders  Ordered    lisinopril (ZESTRIL) 10 MG tablet  Daily     10/04/19 1156           Alden Hipp 10/04/19 1242    Sabas Sous, MD 10/05/19 [phone]

## 2019-10-04 NOTE — Discharge Instructions (Signed)
Your lab work today is reassuring, your blood pressure medication has been refilled.  This medication should be $4 at most pharmacies. Follow-up with either [facility] or [facility], call today to discuss scheduling appointment to establish care for further management of your blood pressure. Rest today, be sure to drink plenty of hydrating fluids such as Gatorade.  Apply warm compresses to your legs with gentle stretching to help with the muscle cramps.

## 2020-01-24 NOTE — Telephone Encounter (Signed)
Prescription sent pharmacy of patient's choice.

## 2020-06-16 ENCOUNTER — Encounter (HOSPITAL_COMMUNITY): Payer: Self-pay | Admitting: Emergency Medicine

## 2020-06-16 ENCOUNTER — Emergency Department (HOSPITAL_COMMUNITY)
Admission: EM | Admit: 2020-06-16 | Discharge: 2020-06-16 | Disposition: A | Discharge status: Home / Self Care | Payer: Self-pay | Attending: Emergency Medicine | Admitting: Emergency Medicine

## 2020-06-16 DIAGNOSIS — Z79899 Other long term (current) drug therapy: Secondary | ICD-10-CM | POA: Insufficient documentation

## 2020-06-16 DIAGNOSIS — F17210 Nicotine dependence, cigarettes, uncomplicated: Secondary | ICD-10-CM | POA: Insufficient documentation

## 2020-06-16 DIAGNOSIS — I10 Essential (primary) hypertension: Secondary | ICD-10-CM | POA: Insufficient documentation

## 2020-06-16 DIAGNOSIS — L732 Hidradenitis suppurativa: Secondary | ICD-10-CM | POA: Insufficient documentation

## 2020-06-16 MED ORDER — ACETAMINOPHEN 325 MG PO TABS
650.0000 mg | ORAL_TABLET | Freq: Once | ORAL | Status: AC
  Administered 2020-06-16: 650 mg via ORAL
  Filled 2020-06-16: qty 2

## 2020-06-16 MED ORDER — LISINOPRIL 10 MG PO TABS
10.0000 mg | ORAL_TABLET | Freq: Once | ORAL | Status: AC
  Administered 2020-06-16: 10 mg via ORAL
  Filled 2020-06-16: qty 1

## 2020-06-16 MED ORDER — DOXYCYCLINE HYCLATE 100 MG PO CAPS: 100.0000 mg | ORAL_CAPSULE | Freq: Two times a day (BID) | ORAL | 0 refills | Status: DC

## 2020-06-16 NOTE — Discharge Instructions (Signed)
Call the follow-up resources given to establish care with a primary care provider. Take your blood pressure medication as prescribed. It is critical that you follow-up with primary care for blood pressure management.  Take doxycycline as prescribed for the infection/inflammation in your armpit area. You can continue to apply warm compresses to this area. As discussed, this condition is chronic/recurring, there are various approaches for treating this problem however this is managed by your family doctor.

## 2020-06-16 NOTE — ED Triage Notes (Signed)
Pt reports boil under L arm x2 weeks, has done hot soaks and warm wash cloths to area without relief.

## 2020-06-16 NOTE — ED Provider Notes (Signed)
[facility] EMERGENCY DEPARTMENT Provider Note   CSN: [id number] Arrival date & time: 06/16/20  1206     History Chief Complaint  Patient presents with  . Recurrent Skin Infections    Robin Dudley is a 34 y.o. female.  34 year old female presents with complaint of a boil under her left armpit present for the past 2 weeks, not improving with warm compresses. Reports recurrent problem. Denies drainage from the area. Patient has a history of hypertension, is prescribed lisinopril however has not refilled her medication (does have a refill available). Patient denies headache, visual disturbance, chest pain. No other complaints or concerns today.        History reviewed. No pertinent past medical history.  There are no problems to display for this patient.   History reviewed. No pertinent surgical history.   OB History    Gravida  1   Para      Term      Preterm      AB  1   Living        SAB  1   TAB      Ectopic      Multiple      Live Births              No family history on file.  Social History   Tobacco Use  . Smoking status: Current Every Day Smoker    Packs/day: 0.50    Types: Cigarettes  Substance Use Topics  . Alcohol use: No  . Drug use: No    Home Medications Prior to Admission medications   Medication Sig Start Date End Date Taking? Authorizing Provider  Aspirin-Acetaminophen-Caffeine (EXCEDRIN EXTRA STRENGTH PO) Take 2 tablets by mouth once.    [provider]  azithromycin (ZITHROMAX) 500 MG tablet Take 2 tablets (1,000 mg total) by mouth once. 08/06/15   Katrinka Blazing, [place], CNM  doxycycline (VIBRAMYCIN) 100 MG capsule Take 1 capsule (100 mg total) by mouth 2 (two) times daily. 06/16/20   Jeannie Fend, PA-C  ibuprofen (ADVIL,MOTRIN) 600 MG tablet Take 1 tablet (600 mg total) by mouth every 6 (six) hours as needed. 07/30/15   Loren Racer, MD  lisinopril (ZESTRIL) 10 MG tablet Take 1 tablet (10 mg  total) by mouth daily. 10/04/19   Jeannie Fend, PA-C  oxyCODONE-acetaminophen (PERCOCET/ROXICET) 5-325 MG per tablet Take 1-2 tablets by mouth every 6 (six) hours as needed. 07/30/15   Katrinka Blazing, [place], CNM  promethazine (PHENERGAN) 25 MG tablet Take 1 tablet (25 mg total) by mouth every 6 (six) hours as needed. 07/30/15   Katrinka Blazing, [place], CNM    Allergies    Patient has no known allergies.  Review of Systems   Review of Systems  Constitutional: Negative for fever.  Eyes: Negative for visual disturbance.  Respiratory: Negative for shortness of breath.   Cardiovascular: Negative for chest pain.  Gastrointestinal: Negative for nausea and vomiting.  Skin: Negative for wound.  Neurological: Negative for weakness and numbness.    Physical Exam Updated Vital Signs BP (!) 182/110 (BP Location: Left Arm)   Pulse 89   Temp 98.9 F (37.2 C) (Oral)   Resp 15   SpO2 99%   Physical Exam Vitals and nursing note reviewed.  Constitutional:      General: She is not in acute distress.    Appearance: She is well-developed. She is not diaphoretic.  HENT:     Head: Normocephalic and atraumatic.  Cardiovascular:  Rate and Rhythm: Normal rate and regular rhythm.     Pulses: Normal pulses.     Heart sounds: Normal heart sounds.  Pulmonary:     Effort: Pulmonary effort is normal.     Breath sounds: Normal breath sounds.  Musculoskeletal:        General: Tenderness present.       Arms:  Skin:    General: Skin is warm and dry.     Findings: Erythema present. No rash.  Neurological:     Mental Status: She is alert and oriented to person, place, and time.  Psychiatric:        Behavior: Behavior normal.     ED Results / Procedures / Treatments   Labs (all labs ordered are listed, but only abnormal results are displayed) Labs Reviewed - No data to display  EKG None  Radiology No results found.  Procedures .Marland KitchenIncision and Drainage  Date/Time: 06/16/2020 2:29 PM Performed by:  Jeannie Fend, PA-C Authorized by: Jeannie Fend, PA-C   Consent:    Consent obtained:  Verbal   Consent given by:  Patient   Risks discussed:  Bleeding, incomplete drainage, pain and damage to other organs   Alternatives discussed:  No treatment Universal protocol:    Procedure explained and questions answered to patient or proxy's satisfaction: yes     Relevant documents present and verified: yes     Test results available and properly labeled: yes     Imaging studies available: yes     Required blood products, implants, devices, and special equipment available: yes     Site/side marked: yes     Immediately prior to procedure a time out was called: yes     Patient identity confirmed:  Verbally with patient Location:    Type:  Abscess   Location:  Upper extremity   Upper extremity location: left axilla. Pre-procedure details:    Skin preparation:  Antiseptic wash Anesthesia (see MAR for exact dosages):    Anesthesia method:  None Procedure type:    Complexity:  Simple Procedure details:    Needle aspiration: yes     Needle size:  20 G   Drainage:  Purulent   Drainage amount:  Moderate   Packing materials:  None Post-procedure details:    Patient tolerance of procedure:  Tolerated well, no immediate complications   (including critical care time)  Medications Ordered in ED Medications  lisinopril (ZESTRIL) tablet 10 mg (10 mg Oral Given 06/16/20 1334)  acetaminophen (TYLENOL) tablet 650 mg (650 mg Oral Given 06/16/20 1425)    ED Course  I have reviewed the triage vital signs and the nursing notes.  Pertinent labs & imaging results that were available during my care of the patient were reviewed by me and considered in my medical decision making (see chart for details).  Clinical Course as of Jun 17 1431  Tue Jun 16, 2020  1430 34yo female with left axillary abscess, more consistent with hidradenitis with acute inflammation.  Aspiration of area with drainage of 3 cc  purulent material with improvement in discomfort.  Recommend patient continue with warm compresses, will start doxycycline twice daily.  Patient given information for [facility] as well as [facility], strongly encouraged to follow-up with PCP for further management of her blood pressure, discussed management of hidradenitis as a chronic condition that will require further management as well.   [LM]  1431 Given lisinopril for her blood pressure, blood pressure is improved, she  has a refill of this medication available at her pharmacy.   [LM]    Clinical Course User Index [LM] Alden Hipp   MDM Rules/Calculators/A&P                          Final Clinical Impression(s) / ED Diagnoses Final diagnoses:  Hydradenitis  Hypertension, unspecified type    Rx / DC Orders ED Discharge Orders         Ordered    doxycycline (VIBRAMYCIN) 100 MG capsule  2 times daily     Discontinue  Reprint     06/16/20 1321           Jeannie Fend, PA-C 06/16/20 1432    Benjiman Core, MD 06/16/20 [id number]

## 2020-06-16 NOTE — ED Notes (Signed)
Pt verbalized understanding of discharge instructions. Follow up care and prescriptions reviewed, pt had no further questions. Ambulated independently to lobby. 

## 2021-12-24 ENCOUNTER — Inpatient Hospital Stay (HOSPITAL_COMMUNITY)
Admission: EM | Admit: 2021-12-24 | Discharge: 2021-12-25 | DRG: 291 | Disposition: A | Discharge status: Home / Self Care | Payer: Self-pay | Attending: Family Medicine | Admitting: Family Medicine

## 2021-12-24 ENCOUNTER — Inpatient Hospital Stay (HOSPITAL_COMMUNITY): Payer: Self-pay

## 2021-12-24 ENCOUNTER — Emergency Department (HOSPITAL_COMMUNITY): Payer: Self-pay

## 2021-12-24 ENCOUNTER — Encounter (HOSPITAL_COMMUNITY): Payer: Self-pay

## 2021-12-24 ENCOUNTER — Other Ambulatory Visit: Payer: Self-pay

## 2021-12-24 DIAGNOSIS — D649 Anemia, unspecified: Secondary | ICD-10-CM | POA: Diagnosis present

## 2021-12-24 DIAGNOSIS — Z79899 Other long term (current) drug therapy: Secondary | ICD-10-CM

## 2021-12-24 DIAGNOSIS — J9601 Acute respiratory failure with hypoxia: Secondary | ICD-10-CM

## 2021-12-24 DIAGNOSIS — F17210 Nicotine dependence, cigarettes, uncomplicated: Secondary | ICD-10-CM | POA: Diagnosis present

## 2021-12-24 DIAGNOSIS — M7989 Other specified soft tissue disorders: Secondary | ICD-10-CM

## 2021-12-24 DIAGNOSIS — I10 Essential (primary) hypertension: Secondary | ICD-10-CM

## 2021-12-24 DIAGNOSIS — I5021 Acute systolic (congestive) heart failure: Secondary | ICD-10-CM | POA: Diagnosis present

## 2021-12-24 DIAGNOSIS — I517 Cardiomegaly: Secondary | ICD-10-CM

## 2021-12-24 DIAGNOSIS — Z20822 Contact with and (suspected) exposure to covid-19: Secondary | ICD-10-CM | POA: Diagnosis present

## 2021-12-24 DIAGNOSIS — D509 Iron deficiency anemia, unspecified: Secondary | ICD-10-CM | POA: Diagnosis present

## 2021-12-24 DIAGNOSIS — E871 Hypo-osmolality and hyponatremia: Secondary | ICD-10-CM | POA: Diagnosis present

## 2021-12-24 DIAGNOSIS — T464X6A Underdosing of angiotensin-converting-enzyme inhibitors, initial encounter: Secondary | ICD-10-CM | POA: Diagnosis present

## 2021-12-24 DIAGNOSIS — D72829 Elevated white blood cell count, unspecified: Secondary | ICD-10-CM | POA: Diagnosis present

## 2021-12-24 DIAGNOSIS — Z6833 Body mass index (BMI) 33.0-33.9, adult: Secondary | ICD-10-CM

## 2021-12-24 DIAGNOSIS — Z91138 Patient's unintentional underdosing of medication regimen for other reason: Secondary | ICD-10-CM

## 2021-12-24 DIAGNOSIS — R778 Other specified abnormalities of plasma proteins: Secondary | ICD-10-CM | POA: Diagnosis present

## 2021-12-24 DIAGNOSIS — E66811 Obesity, class 1: Secondary | ICD-10-CM | POA: Diagnosis present

## 2021-12-24 DIAGNOSIS — J810 Acute pulmonary edema: Secondary | ICD-10-CM

## 2021-12-24 DIAGNOSIS — E8809 Other disorders of plasma-protein metabolism, not elsewhere classified: Secondary | ICD-10-CM | POA: Diagnosis present

## 2021-12-24 DIAGNOSIS — Z720 Tobacco use: Secondary | ICD-10-CM | POA: Diagnosis present

## 2021-12-24 DIAGNOSIS — E669 Obesity, unspecified: Secondary | ICD-10-CM | POA: Diagnosis present

## 2021-12-24 DIAGNOSIS — I509 Heart failure, unspecified: Secondary | ICD-10-CM

## 2021-12-24 DIAGNOSIS — I110 Hypertensive heart disease with heart failure: Principal | ICD-10-CM | POA: Diagnosis present

## 2021-12-24 HISTORY — DX: Essential (primary) hypertension: I10

## 2021-12-24 LAB — CBC WITH DIFFERENTIAL/PLATELET
Abs Immature Granulocytes: 0.23 10*3/uL — ABNORMAL HIGH (ref 0.00–0.07)
Basophils Absolute: 0.1 10*3/uL (ref 0.0–0.1)
Basophils Relative: 1 %
Eosinophils Absolute: 0.3 10*3/uL (ref 0.0–0.5)
Eosinophils Relative: 2 %
HCT: 34.1 % — ABNORMAL LOW (ref 36.0–46.0)
Hemoglobin: 11 g/dL — ABNORMAL LOW (ref 12.0–15.0)
Immature Granulocytes: 2 %
Lymphocytes Relative: 16 %
Lymphs Abs: 2.4 10*3/uL (ref 0.7–4.0)
MCH: 28 pg (ref 26.0–34.0)
MCHC: 32.3 g/dL (ref 30.0–36.0)
MCV: 86.8 fL (ref 80.0–100.0)
Monocytes Absolute: 1 10*3/uL (ref 0.1–1.0)
Monocytes Relative: 6 %
Neutro Abs: 11.5 10*3/uL — ABNORMAL HIGH (ref 1.7–7.7)
Neutrophils Relative %: 73 %
Platelets: 372 10*3/uL (ref 150–400)
RBC: 3.93 MIL/uL (ref 3.87–5.11)
RDW: 14.6 % (ref 11.5–15.5)
WBC: 15.5 10*3/uL — ABNORMAL HIGH (ref 4.0–10.5)
nRBC: 0 % (ref 0.0–0.2)

## 2021-12-24 LAB — COMPREHENSIVE METABOLIC PANEL
ALT: 31 U/L (ref 0–44)
AST: 26 U/L (ref 15–41)
Albumin: 3.4 g/dL — ABNORMAL LOW (ref 3.5–5.0)
Alkaline Phosphatase: 74 U/L (ref 38–126)
Anion gap: 7 (ref 5–15)
BUN: 19 mg/dL (ref 6–20)
CO2: 24 mmol/L (ref 22–32)
Calcium: 8.2 mg/dL — ABNORMAL LOW (ref 8.9–10.3)
Chloride: 102 mmol/L (ref 98–111)
Creatinine, Ser: 1.11 mg/dL — ABNORMAL HIGH (ref 0.44–1.00)
GFR, Estimated: >60 mL/min (ref >60)
Glucose, Bld: 112 mg/dL — ABNORMAL HIGH (ref 70–99)
Potassium: 3.8 mmol/L (ref 3.5–5.1)
Sodium: 133 mmol/L — ABNORMAL LOW (ref 135–145)
Total Bilirubin: 0.7 mg/dL (ref 0.3–1.2)
Total Protein: 7.2 g/dL (ref 6.5–8.1)

## 2021-12-24 LAB — ECHOCARDIOGRAM COMPLETE
AR max vel: 1.79 cm2
AV Area VTI: 1.91 cm2
AV Area mean vel: 1.7 cm2
AV Mean grad: 7 mmHg
AV Peak grad: 13.5 mmHg
Ao pk vel: 1.84 m/s
Area-P 1/2: 5.23 cm2
Calc EF: 37.7 %
Height: 65 in
S' Lateral: 4.5 cm
Single Plane A2C EF: 33.5 %
Single Plane A4C EF: 39.2 %
Weight: 3200 oz

## 2021-12-24 LAB — I-STAT BETA HCG BLOOD, ED (MC, WL, AP ONLY): I-stat hCG, quantitative: 7.3 m[IU]/mL — ABNORMAL HIGH (ref <5)

## 2021-12-24 LAB — D-DIMER, QUANTITATIVE: D-Dimer, Quant: 1.94 ug/mL-FEU — ABNORMAL HIGH (ref 0.00–0.50)

## 2021-12-24 LAB — TROPONIN I (HIGH SENSITIVITY)
Troponin I (High Sensitivity): 92 ng/L — ABNORMAL HIGH (ref <18)
Troponin I (High Sensitivity): 78 ng/L — ABNORMAL HIGH (ref <18)

## 2021-12-24 LAB — RESP PANEL BY RT-PCR (FLU A&B, COVID) ARPGX2
Influenza A by PCR: NEGATIVE
Influenza B by PCR: NEGATIVE
SARS Coronavirus 2 by RT PCR: NEGATIVE

## 2021-12-24 LAB — BRAIN NATRIURETIC PEPTIDE: B Natriuretic Peptide: 899.8 pg/mL — ABNORMAL HIGH (ref 0.0–100.0)

## 2021-12-24 LAB — MAGNESIUM: Magnesium: 1.9 mg/dL (ref 1.7–2.4)

## 2021-12-24 MED ORDER — POTASSIUM CHLORIDE CRYS ER 20 MEQ PO TBCR
20.0000 meq | EXTENDED_RELEASE_TABLET | Freq: Every day | ORAL | Status: DC
  Administered 2021-12-24 – 2021-12-25 (×2): 20 meq via ORAL
  Filled 2021-12-24 (×2): qty 1

## 2021-12-24 MED ORDER — MAGNESIUM OXIDE -MG SUPPLEMENT 400 (240 MG) MG PO TABS
400.0000 mg | ORAL_TABLET | Freq: Every day | ORAL | Status: DC
  Administered 2021-12-24 – 2021-12-25 (×2): 400 mg via ORAL
  Filled 2021-12-24 (×2): qty 1

## 2021-12-24 MED ORDER — ACETAMINOPHEN 500 MG PO TABS
1000.0000 mg | ORAL_TABLET | Freq: Once | ORAL | Status: AC
Start: 2021-12-24 — End: 2021-12-24
  Administered 2021-12-24: 1000 mg via ORAL
  Filled 2021-12-24: qty 2

## 2021-12-24 MED ORDER — FUROSEMIDE 10 MG/ML IJ SOLN
40.0000 mg | Freq: Once | INTRAMUSCULAR | Status: AC
  Administered 2021-12-24: 40 mg via INTRAVENOUS
  Filled 2021-12-24: qty 4

## 2021-12-24 MED ORDER — IOHEXOL 350 MG/ML SOLN
80.0000 mL | Freq: Once | INTRAVENOUS | Status: AC | PRN
  Administered 2021-12-24: 80 mL via INTRAVENOUS

## 2021-12-24 MED ORDER — LISINOPRIL 2.5 MG PO TABS
2.5000 mg | ORAL_TABLET | Freq: Every day | ORAL | Status: DC
  Filled 2021-12-24: qty 1

## 2021-12-24 MED ORDER — NITROGLYCERIN 0.4 MG SL SUBL
0.4000 mg | SUBLINGUAL_TABLET | SUBLINGUAL | Status: AC | PRN
  Administered 2021-12-24 (×2): 0.4 mg via SUBLINGUAL
  Filled 2021-12-24 (×2): qty 1

## 2021-12-24 MED ORDER — SODIUM CHLORIDE (PF) 0.9 % IJ SOLN
INTRAMUSCULAR | Status: AC
  Filled 2021-12-24: qty 50

## 2021-12-24 MED ORDER — ACETAMINOPHEN 325 MG PO TABS
650.0000 mg | ORAL_TABLET | Freq: Four times a day (QID) | ORAL | Status: DC | PRN
  Administered 2021-12-24: 650 mg via ORAL
  Filled 2021-12-24: qty 2

## 2021-12-24 MED ORDER — LISINOPRIL 10 MG PO TABS
10.0000 mg | ORAL_TABLET | Freq: Every day | ORAL | Status: DC
  Administered 2021-12-24 – 2021-12-25 (×2): 10 mg via ORAL
  Filled 2021-12-24 (×2): qty 1

## 2021-12-24 MED ORDER — ONDANSETRON HCL 4 MG PO TABS: 4.0000 mg | ORAL_TABLET | Freq: Four times a day (QID) | ORAL | Status: DC | PRN

## 2021-12-24 MED ORDER — ONDANSETRON HCL 4 MG/2ML IJ SOLN: 4.0000 mg | Freq: Four times a day (QID) | INTRAMUSCULAR | Status: DC | PRN

## 2021-12-24 MED ORDER — ALPRAZOLAM 0.5 MG PO TABS
0.5000 mg | ORAL_TABLET | Freq: Once | ORAL | Status: AC
  Administered 2021-12-24: 0.5 mg via ORAL
  Filled 2021-12-24: qty 1

## 2021-12-24 MED ORDER — NITROGLYCERIN 2 % TD OINT
1.0000 [in_us] | TOPICAL_OINTMENT | Freq: Four times a day (QID) | TRANSDERMAL | Status: AC
  Administered 2021-12-24 (×2): 1 [in_us] via TOPICAL
  Filled 2021-12-24 (×2): qty 1

## 2021-12-24 MED ORDER — NICOTINE 21 MG/24HR TD PT24: 21.0000 mg | MEDICATED_PATCH | Freq: Every day | TRANSDERMAL | Status: DC | PRN

## 2021-12-24 MED ORDER — ENOXAPARIN SODIUM 40 MG/0.4ML IJ SOSY: 40.0000 mg | PREFILLED_SYRINGE | INTRAMUSCULAR | Status: DC

## 2021-12-24 MED ORDER — ENOXAPARIN SODIUM 40 MG/0.4ML IJ SOSY
40.0000 mg | PREFILLED_SYRINGE | INTRAMUSCULAR | Status: DC
  Administered 2021-12-24: 40 mg via SUBCUTANEOUS
  Filled 2021-12-24: qty 0.4

## 2021-12-24 MED ORDER — ACETAMINOPHEN 650 MG RE SUPP: 650.0000 mg | Freq: Four times a day (QID) | RECTAL | Status: DC | PRN

## 2021-12-24 MED ORDER — LABETALOL HCL 5 MG/ML IV SOLN
5.0000 mg | Freq: Once | INTRAVENOUS | Status: AC
  Administered 2021-12-24: 5 mg via INTRAVENOUS
  Filled 2021-12-24: qty 4

## 2021-12-24 MED ORDER — FUROSEMIDE 10 MG/ML IJ SOLN
20.0000 mg | Freq: Two times a day (BID) | INTRAMUSCULAR | Status: DC
  Administered 2021-12-24 – 2021-12-25 (×2): 20 mg via INTRAVENOUS
  Filled 2021-12-24 (×2): qty 4

## 2021-12-24 MED ORDER — TRAZODONE HCL 100 MG PO TABS: 50.0000 mg | ORAL_TABLET | Freq: Every evening | ORAL | Status: DC | PRN

## 2021-12-24 MED ORDER — HYDRALAZINE HCL 25 MG PO TABS
25.0000 mg | ORAL_TABLET | Freq: Four times a day (QID) | ORAL | Status: DC | PRN
  Administered 2021-12-24 – 2021-12-25 (×2): 25 mg via ORAL
  Filled 2021-12-24 (×2): qty 1

## 2021-12-24 NOTE — Progress Notes (Signed)
Right lower extremity venous duplex has been completed. Preliminary results can be found in CV Proc through chart review.   12/24/21 10:15 AM Olen Cordial RVT

## 2021-12-24 NOTE — ED Notes (Signed)
Pt care taken, no complaints at this time, is eating supper.

## 2021-12-24 NOTE — H&P (Signed)
History and Physical    Robin Dudley [id number] DOB: 1986/07/26 DOA: 12/24/2021  PCP: Patient, No Pcp Per (Inactive)  Patient coming from: Home.   I have personally briefly reviewed patient's old medical records in [place]  Chief Complaint: Shortness of breath.  HPI: Robin Dudley is a 36 y.o. female with medical history significant of hypertension, class I obesity, tobacco abuse who comes into the emergency department complaints of a 2-week history of progressively worse dyspnea associated with dry cough, orthopnea and bilateral lower extremity edema for the past week.  She has been off antihypertensives pain last year when she was prescribed with 80-day supply to lisinopril 10 mg after coming to the emergency department.  She does not have a PCP to follow-up with at the moment due to lack of insurance.  No chest pain or chest pressure, palpitations, nausea, emesis, diaphoresis, fever, chills, night sweats, rhinorrhea, sore throat, hemoptysis, but stated that she has been wheezing at times.  She occasionally gets constipated, but no abdominal pain, diarrhea, melena or hematochezia.  Denied flank pain, dysuria, frequency or hematuria.  No polyuria, polydipsia, polyphagia or blurred vision.  ED Course: Initial vital signs were temperature 98.6 F, pulse 28, respirations 24, BP 123/1 49 mmHg O2 sat 87% on room air.  The patient received supplemental oxygen, furosemide 40 mg IVP, nitroglycerin 0.4 mg sublingually times 2 doses, labetalol 5 mg IVP and acetaminophen 1000 mg p.o. x1.  I added 1 inch of Nitropaste to anterior chest wall.  Lab work: CBC is her white count 15.5, hemoglobin 11.0 g/dL platelets 372.  D-dimer was 1.94 mcg/mL.  Troponin was 92 and then 78 ng/L.  BNP 899.8 pg/mL.  CMP shows a sodium of 133 mmol/L.  Glucose 112, creatinine 1.11 and calcium 8.2 mg/dL.  Albumin was 3.4 g/dL.  The rest of the CMP values were normal.  Imaging: 2 view chest radiograph show enlarged heart  silhouette with small pleural effusions forming.  There was alveolar edema.  CTA chest did not show PE.  There was asymmetric right-sided airspace disease favoring edema over pneumonia.  Please see images and full radiology report for further details.  Review of Systems: As per HPI otherwise all other systems reviewed and are negative.  Past Medical History:  Diagnosis Date   Hypertension 12/24/2021   History reviewed. No pertinent surgical history.  Social History  reports that she has been smoking cigarettes. She has been smoking an average of .5 packs per day. She does not have any smokeless tobacco history on file. She reports current alcohol use. She reports that she does not use drugs.  No Known Allergies  Family History  Family history unknown: Yes   Prior to Admission medications   Medication Sig Start Date End Date Taking? Authorizing Provider  acetaminophen (TYLENOL) 650 MG CR tablet Take 1,300 mg by mouth every 8 (eight) hours as needed for pain.   Yes [provider]  OVER THE COUNTER MEDICATION Take 1 each by mouth daily. Glow Gummy - apple cider vinegar with the mother   Yes [provider]  lisinopril (ZESTRIL) 10 MG tablet Take 1 tablet (10 mg total) by mouth daily. Patient not taking: Reported on 12/24/2021 10/04/19   Tacy Learn, PA-C   Physical Exam: Vitals:   12/24/21 0520 12/24/21 0735 12/24/21 0808 12/24/21 0830  BP: (!) 180/105 (!) 177/113 (!) 185/112 (!) 186/120  Pulse: (!) 118 (!) 111 (!) 109 (!) 111  Resp: 19 11 20  (!) 27  Temp:      TempSrc:      SpO2: 95% 94% 94% 97%  Weight:      Height:       Constitutional: NAD, calm, comfortable Eyes: PERRL, lids and conjunctivae normal ENMT: [place] in place.  Mucous membranes are moist. Posterior pharynx clear of any exudate or lesions. Neck: normal, supple, no masses, no thyromegaly Respiratory: clear to auscultation bilaterally, no wheezing, bibasilar crackles. Normal respiratory effort. No  accessory muscle use.  Cardiovascular: Regular rate and rhythm, no murmurs / rubs / gallops. No extremity edema. 2+ pedal pulses. No carotid bruits.  Abdomen: Obese, no distention.  Soft, no tenderness, no masses palpated. No hepatosplenomegaly. Bowel sounds positive.  Musculoskeletal: no clubbing / cyanosis.  Good ROM, no contractures. Normal muscle tone.  Skin: no rashes, lesions, ulcers. No induration Neurologic: CN 2-12 grossly intact. Sensation intact, DTR normal. Strength 5/5 in all 4.  Psychiatric: Normal judgment and insight. Alert and oriented x 3.  Tearful, mildly anxious mood.   Labs on Admission: I have personally reviewed following labs and imaging studies  CBC: Recent Labs  Lab 12/24/21 0140  WBC 15.5*  NEUTROABS 11.5*  HGB 11.0*  HCT 34.1*  MCV 86.8  PLT [id number]    Basic Metabolic Panel: Recent Labs  Lab 12/24/21 0140  NA 133*  K 3.8  CL 102  CO2 24  GLUCOSE 112*  BUN 19  CREATININE 1.11*  CALCIUM 8.2*  MG 1.9   GFR: Estimated Creatinine Clearance: 77.2 mL/min (A) (by C-G formula based on SCr of 1.11 mg/dL (H)).  Liver Function Tests: Recent Labs  Lab 12/24/21 0140  AST 26  ALT 31  ALKPHOS 74  BILITOT 0.7  PROT 7.2  ALBUMIN 3.4*   Radiological Exams on Admission: DG Chest 2 View  Result Date: 12/24/2021 CLINICAL DATA:  Shortness of breath, back pain. EXAM: CHEST - 2 VIEW COMPARISON:  None. FINDINGS: The heart is moderately enlarged. The mediastinal configuration is within normal limits. The central vessels are not well seen due to airspace disease but are probably at least mildly distended. Small perfusion effusions beginning to develop and there are subpleural septal Kerley B lines in the bases compatible with edema There are bilateral patchy perihilar alveolar opacities in the right-greater-than-left upper, mid and lower lung fields sparing the periphery, findings most likely due to airspace edema. Underlying pneumonia is possible in the proper  clinical setting. Thoracic cage is intact.  There is slight thoracic dextroscoliosis. IMPRESSION: 1. Enlarged heart silhouette, overall findings favoring CHF or fluid overload with small pleural effusions forming. 2. Right greater than left perihilar alveolar opacities sparing the periphery, most if not all of which most likely represent alveolar edema. Underlying pneumonia would be difficult to exclude. 3. Clinical correlation and radiographic follow-up recommended. No other evidence of acute chest disease. Electronically Signed   By: Telford Nab M.D.   On: 12/24/2021 02:30   CT Angio Chest PE W and/or Wo Contrast  Result Date: 12/24/2021 CLINICAL DATA:  Positive D-dimer with pulmonary embolism suspected. Back pain and shortness of breath for 2 weeks negative COVID and flu tests recently EXAM: CT ANGIOGRAPHY CHEST WITH CONTRAST TECHNIQUE: Multidetector CT imaging of the chest was performed using the standard protocol during bolus administration of intravenous contrast. Multiplanar CT image reconstructions and MIPs were obtained to evaluate the vascular anatomy. RADIATION DOSE REDUCTION: This exam was performed according to the departmental dose-optimization program which includes automated exposure control, adjustment of the mA and/or kV according  to patient size and/or use of iterative reconstruction technique. CONTRAST:  103mL OMNIPAQUE IOHEXOL 350 MG/ML SOLN COMPARISON:  None. FINDINGS: Cardiovascular: Satisfactory opacification of the pulmonary arteries to the segmental level. No evidence of pulmonary embolism. Enlarged heart size. No pericardial effusion. Mediastinum/Nodes: No worrisome lymph nodes, mass, or air leak Lungs/Pleura: Interlobular septal thickening, small right pleural effusion, and airspace opacity in the right more than left lungs. Upper Abdomen: Hepatic venous reflux. The a patent cava is prominently rounded/distended. Musculoskeletal: No acute finding Review of the MIP images confirms  the above findings. IMPRESSION: 1. CHF. 2. Asymmetric right-sided airspace disease favors alveolar edema over pneumonia. 3. Negative for pulmonary embolism. Electronically Signed   By: Jorje Guild M.D.   On: 12/24/2021 04:53    EKG: Independently reviewed.  Vent. rate 123 BPM PR interval 145 ms QRS duration 85 ms QT/QTcB 338/484 ms P-R-T axes 71 48 212 Sinus tachycardia Left atrial enlargement Probable left ventricular hypertrophy Nonspecific T abnormalities, lateral leads  Assessment/Plan Principal Problem:   Acute unspecified CHF (HCC) Observation/telemetry. Continue supplemental oxygen. Nitropaste every 6 hours x 2. Continue furosemide 20 mg IVP twice daily. Supplement magnesium and potassium. Monitor daily weight, intake and output. Follow-up renal function electrolytes. Check echocardiogram. Inpatient or outpatient evaluation by cardiology.  Active Problems:   Hypertension Resume lisinopril 10 mg p.o. daily. On furosemide 20 mg IV twice daily. Hydralazine 25 mg every 6 hours as needed. Begin beta-blocker once symptoms are better. Monitor blood pressure and heart rate. Monitor renal function and electrolytes. Needs to establish with PCP to follow-up closely.    Elevated troponin Likely demand ischemia. Trending down. Check echocardiogram.    Hypocalcemia Recheck calcium and albumin level in AM. Further work-up depending on these results.    Hyponatremia In the setting of HF hypervolemia. Follow-up sodium level.    Normocytic anemia Denied heavy menstrual periods Follow hematocrit and hemoglobin.    Leukocytosis Monitor WBC.    Hypoalbuminemia Dilutional? Follow-up albumin level.    Class 1 obesity Lifestyle modifications. Follow-up closely with a primary care provider.    Tobacco use Nicotine replacement therapy offered. Tobacco cessation strongly advised.     DVT prophylaxis: Lovenox SQ. Code Status:   Full code. Family Communication:    Disposition Plan:   Patient is from:  Home.  Anticipated DC to:  Home.  Anticipated DC date:  24 to 28 hours.  Anticipated DC barriers: Clinical status.  Consults called:   Admission status:  Observation/telemetry.  Severity of Illness: High severity in the setting of volume overload secondary to unspecified heart failure.  Reubin Milan MD [facility]  How to contact the [facility] Attending or Consulting provider [place] or covering provider during after hours [place], for this patient?   Check the care team in [facility] and look for a) attending/consulting [facility] provider listed and b) the [facility] team listed Log into [email or web address] and use [place] universal password to access. If you do not have the password, please contact the hospital operator. Locate the [facility] provider you are looking for under [facility] and page to a number that you can be directly reached. If you still have difficulty reaching the provider, please page the [facility] (Director on Call) for the Hospitalists listed on amion for assistance.  12/24/2021, 9:17 AM   This document preparation Paramedic and may contain some unintended transcription errors.

## 2021-12-24 NOTE — ED Provider Notes (Signed)
Emergency Department Provider Note   I have reviewed the triage vital signs and the nursing notes.   HISTORY  Chief Complaint Shortness of Breath and Back Pain   HPI Robin Dudley is a 36 y.o. female with past medical history of hypertension presents emergency department with shortness of breath and mid back tightness.  Symptoms worsened significantly in the past 24 hours but patient notes more mild symptoms over the past 2 weeks.  She not having active chest pain.  No fevers or chills.  She is noticed some right leg swelling in comparison to the left.  She notes history of high blood pressure but has not take any medication.  She does not check her blood pressure regularly. She does smoke but denies any COPD diagnosis. Notes a remote/childhood history of asthma but has not required MDI use as an adult. SOB is worse with ambulation. Describes a "tightness" feeling in the mid back. No sharp/ripping pain. No pleuritic discomfort.     History reviewed. No pertinent past medical history.  Review of Systems  Constitutional: No fever/chills Eyes: No visual changes. ENT: No sore throat. Cardiovascular: Denies chest pain. Respiratory: Positive shortness of breath. Gastrointestinal: No abdominal pain.  No nausea, no vomiting.  No diarrhea.  No constipation. Genitourinary: Negative for dysuria. Musculoskeletal: Positive for back pain. Skin: Negative for rash. Neurological: Negative for headaches, focal weakness or numbness.  10-point ROS otherwise negative.  ____________________________________________   PHYSICAL EXAM:  VITAL SIGNS: ED Triage Vitals  Enc Vitals Group     BP 12/24/21 0134 (!) 223/149     Pulse Rate 12/24/21 0134 (!) 128     Resp 12/24/21 0134 (!) 24     Temp 12/24/21 0134 98.6 F (37 C)     Temp Source 12/24/21 0134 Oral     SpO2 12/24/21 0134 (!) 87 %     Weight 12/24/21 0134 200 lb (90.7 kg)     Height 12/24/21 0134 5\' 5"  (1.651 m)   Constitutional:  Alert and oriented. Some increased respiratory effort but at rest is more comfortable and speaking in 4-5 word sentences.  Eyes: Conjunctivae are normal.  Head: Atraumatic. Nose: No congestion/rhinnorhea. Mouth/Throat: Mucous membranes are moist.  Neck: No stridor.   Cardiovascular: Tachycardia. Good peripheral circulation. Grossly normal heart sounds.   Respiratory: Slight increased respiratory effort.  No retractions. Lungs CTAB. Gastrointestinal: Soft and nontender. No distention.  Musculoskeletal: No lower extremity tenderness. Mild pitting edema bilaterally with right leg subjectively more swollen. No gross deformities of extremities. Neurologic:  Normal speech and language. No gross focal neurologic deficits are appreciated.  Skin:  Skin is warm, dry and intact. No rash noted.  ____________________________________________   LABS (all labs ordered are listed, but only abnormal results are displayed)  Labs Reviewed  COMPREHENSIVE METABOLIC PANEL - Abnormal; Notable for the following components:      Result Value   Sodium 133 (*)    Glucose, Bld 112 (*)    Creatinine, Ser 1.11 (*)    Calcium 8.2 (*)    Albumin 3.4 (*)    All other components within normal limits  CBC WITH DIFFERENTIAL/PLATELET - Abnormal; Notable for the following components:   WBC 15.5 (*)    Hemoglobin 11.0 (*)    HCT 34.1 (*)    Neutro Abs 11.5 (*)    Abs Immature Granulocytes 0.23 (*)    All other components within normal limits  BRAIN NATRIURETIC PEPTIDE - Abnormal; Notable for the following components:  B Natriuretic Peptide 899.8 (*)    All other components within normal limits  D-DIMER, QUANTITATIVE - Abnormal; Notable for the following components:   D-Dimer, Quant 1.94 (*)    All other components within normal limits  I-STAT BETA HCG BLOOD, ED ([facility], [facility], [facility] ONLY) - Abnormal; Notable for the following components:   I-stat hCG, quantitative 7.3 (*)    All other components within normal limits   TROPONIN I (HIGH SENSITIVITY) - Abnormal; Notable for the following components:   Troponin I (High Sensitivity) 92 (*)    All other components within normal limits  TROPONIN I (HIGH SENSITIVITY) - Abnormal; Notable for the following components:   Troponin I (High Sensitivity) 78 (*)    All other components within normal limits  RESP PANEL BY RT-PCR (FLU A&B, COVID) ARPGX2   ____________________________________________  EKG   EKG Interpretation  Date/Time:  Friday December 24 2021 01:39:59 EST Ventricular Rate:  123 PR Interval:  145 QRS Duration: 85 QT Interval:  338 QTC Calculation: 484 R Axis:   48 Text Interpretation: Sinus tachycardia Left atrial enlargement Probable left ventricular hypertrophy Nonspecific T abnormalities, lateral leads Confirmed by Alona Bene [phone]) on 12/24/2021 1:49:04 AM        ____________________________________________  RADIOLOGY  DG Chest 2 View  Result Date: 12/24/2021 CLINICAL DATA:  Shortness of breath, back pain. EXAM: CHEST - 2 VIEW COMPARISON:  None. FINDINGS: The heart is moderately enlarged. The mediastinal configuration is within normal limits. The central vessels are not well seen due to airspace disease but are probably at least mildly distended. Small perfusion effusions beginning to develop and there are subpleural septal Kerley B lines in the bases compatible with edema There are bilateral patchy perihilar alveolar opacities in the right-greater-than-left upper, mid and lower lung fields sparing the periphery, findings most likely due to airspace edema. Underlying pneumonia is possible in the proper clinical setting. Thoracic cage is intact.  There is slight thoracic dextroscoliosis. IMPRESSION: 1. Enlarged heart silhouette, overall findings favoring CHF or fluid overload with small pleural effusions forming. 2. Right greater than left perihilar alveolar opacities sparing the periphery, most if not all of which most likely represent  alveolar edema. Underlying pneumonia would be difficult to exclude. 3. Clinical correlation and radiographic follow-up recommended. No other evidence of acute chest disease. Electronically Signed   By: Almira Bar M.D.   On: 12/24/2021 02:30   CT Angio Chest PE W and/or Wo Contrast  Result Date: 12/24/2021 CLINICAL DATA:  Positive D-dimer with pulmonary embolism suspected. Back pain and shortness of breath for 2 weeks negative COVID and flu tests recently EXAM: CT ANGIOGRAPHY CHEST WITH CONTRAST TECHNIQUE: Multidetector CT imaging of the chest was performed using the standard protocol during bolus administration of intravenous contrast. Multiplanar CT image reconstructions and MIPs were obtained to evaluate the vascular anatomy. RADIATION DOSE REDUCTION: This exam was performed according to the departmental dose-optimization program which includes automated exposure control, adjustment of the mA and/or kV according to patient size and/or use of iterative reconstruction technique. CONTRAST:  14mL OMNIPAQUE IOHEXOL 350 MG/ML SOLN COMPARISON:  None. FINDINGS: Cardiovascular: Satisfactory opacification of the pulmonary arteries to the segmental level. No evidence of pulmonary embolism. Enlarged heart size. No pericardial effusion. Mediastinum/Nodes: No worrisome lymph nodes, mass, or air leak Lungs/Pleura: Interlobular septal thickening, small right pleural effusion, and airspace opacity in the right more than left lungs. Upper Abdomen: Hepatic venous reflux. The a patent cava is prominently rounded/distended. Musculoskeletal: No acute finding Review  of the MIP images confirms the above findings. IMPRESSION: 1. CHF. 2. Asymmetric right-sided airspace disease favors alveolar edema over pneumonia. 3. Negative for pulmonary embolism. Electronically Signed   By: Tiburcio PeaJonathan  Watts M.D.   On: 12/24/2021 04:53    ____________________________________________   PROCEDURES  Procedure(s) performed:    Procedures  CRITICAL CARE Performed by: Maia PlanJoshua G Ebone Alcivar Total critical care time: 35 minutes Critical care time was exclusive of separately billable procedures and treating other patients. Critical care was necessary to treat or prevent imminent or life-threatening deterioration. Critical care was time spent personally by me on the following activities: development of treatment plan with patient and/or surrogate as well as nursing, discussions with consultants, evaluation of patient's response to treatment, examination of patient, obtaining history from patient or surrogate, ordering and performing treatments and interventions, ordering and review of laboratory studies, ordering and review of radiographic studies, pulse oximetry and re-evaluation of patient's condition.  Alona BeneJoshua India Jolin, MD Emergency Medicine  ____________________________________________   INITIAL IMPRESSION / ASSESSMENT AND PLAN / ED COURSE  Pertinent labs & imaging results that were available during my care of the patient were reviewed by me and considered in my medical decision making (see chart for details).   This patient is Presenting for Evaluation of SOB, which does require a range of treatment options, and is a complaint that involves a high risk of morbidity and mortality.  The Differential Diagnoses include HTN emergency, new CHF, PE, ACS, CAP, COVID, Flu.  Critical Interventions- Nitro SL tabs along with lasix, labs, and CXR.    Reassessment after intervention: SBP reduced from > 200. Patient on 2L [place] which is new with O2 in triage of 88% RA. Continues to look comfortable on this. Not requiring BiPAP or intubation.    I decided to review pertinent External Data, and in summary no prior history of CHF or ECHO in our system for review.   Clinical Laboratory Tests Ordered, included CBC, CMP, BNP, Troponin. Patient with troponin of 92. Suspect demand ischemia from CHF vs HTN emergency. No acute ischemic change on  EKG per my EKG interpretation above. Mild creatinine elevation. BNP> 800 without comparison labs. D-dimer also elevated.   Radiologic Tests Ordered, included CXR and CTA PE. CXR independently interpreted with pulmonary edema pattern and cardiomegaly. CTA confirms edema. No PNA. No PE. Evaluated imaging independently.   Cardiac Monitor Tracing which shows sinsu tachycardia.    Social Determinants of Health Risk no PCP and smoking history. Denies drug use.   Reevaluation with update and discussion with patient. She continues to look well. No distress. Updated on results thus far and plan for admit, diuresis, and likely ECHO with continued BP mgmt.   Consult complete with the Hospitalist.   Discussed patient's case with [facility] to request admission. Patient and family (if present) updated with plan. [facility]   Medical Decision Making: Summary:  Patient presents to the ED with acute hypoxic respiratory failure with very high BP on arrival. Perhaps some component of flash to make symptoms worse tonight but symptoms building over weeks. No CAP symptoms or imaging findings. Some right leg swelling worse that left. D-dimer elevated which prompted CTA. No PE. Patient responding to IV BP meds.   Disposition: Admit  ____________________________________________  FINAL CLINICAL IMPRESSION(S) / ED DIAGNOSES  Final diagnoses:  Acute respiratory failure with hypoxia (HCC)  Acute pulmonary edema (HCC)     MEDICATIONS GIVEN DURING THIS VISIT:  Medications  nitroGLYCERIN (NITROSTAT) SL tablet 0.4  mg (0.4 mg Sublingual Given 12/24/21 0258)  furosemide (LASIX) injection 40 mg (40 mg Intravenous Given 12/24/21 0259)  labetalol (NORMODYNE) injection 5 mg (5 mg Intravenous Given 12/24/21 0258)  sodium chloride (PF) 0.9 % injection (  Given by Other 12/24/21 0438)  iohexol (OMNIPAQUE) 350 MG/ML injection 80 mL (80 mLs Intravenous Contrast Given 12/24/21 0443)  acetaminophen (TYLENOL)  tablet 1,000 mg (1,000 mg Oral Given 12/24/21 0532)    Note:  This document was prepared using Dragon voice recognition software and may include unintentional dictation errors.  Alona Bene, MD, [facility] Emergency Medicine    Diann Bangerter, Arlyss Repress, MD 12/24/21 [phone]

## 2021-12-24 NOTE — ED Notes (Signed)
Patient keeps taking off cardiac cords, BP cuff and pulse oxygen after being replaced multiple times.

## 2021-12-24 NOTE — ED Triage Notes (Signed)
Pt reports lower back pain x 2 weeks associated with shortness of breath. 88% room air in triage with increased work of breathing. Placed on 2L  with improvement to 93%. Denies injury or any trauma.

## 2021-12-25 LAB — HIV ANTIBODY (ROUTINE TESTING W REFLEX): HIV Screen 4th Generation wRfx: NONREACTIVE

## 2021-12-25 LAB — HEMOGLOBIN: Hemoglobin: 10.8 g/dL — ABNORMAL LOW (ref 12.0–15.0)

## 2021-12-25 MED ORDER — ASPIRIN EC 81 MG PO TBEC: 81.0000 mg | DELAYED_RELEASE_TABLET | Freq: Every day | ORAL | 2 refills | Status: DC

## 2021-12-25 MED ORDER — FUROSEMIDE 20 MG PO TABS
20.0000 mg | ORAL_TABLET | Freq: Every day | ORAL | 11 refills | Status: DC
Start: 2021-12-25 — End: 2022-06-20

## 2021-12-25 MED ORDER — LISINOPRIL 10 MG PO TABS: 10.0000 mg | ORAL_TABLET | Freq: Every day | ORAL | 1 refills | Status: DC

## 2021-12-25 NOTE — ED Notes (Signed)
Pt come to ED stating her medications were not at [facility]. Upon further investigating scripts were sent to [facility] on [address]. Verified with pharmacy they have the prescriptions. They have verified. Pt will be updated to location.

## 2021-12-25 NOTE — Discharge Summary (Addendum)
Physician Discharge Summary  Patient ID: Robin Dudley MRN: [id number] DOB/AGE: 1986/03/07 35 y.o.  Admit date: 12/24/2021 Discharge date: 12/25/2021  Admission Diagnoses:  Discharge Diagnoses:  Principal Problem:   Acute CHF (HCC) Active Problems:   Hypertension   Elevated troponin   Hypocalcemia   Hyponatremia   Normocytic anemia   Leukocytosis   Hypoalbuminemia   Class 1 obesity   Tobacco use   Discharged Condition: stable  Hospital Course: 36 year old female with a history of hypertension, obesity comes in with dyspnea on exertion and worsening shortness of breath found to have lower extremity edema and new onset congestive heart failure.  Patient has longstanding uncontrolled blood pressure and is noncompliant with medications.  She reports that her work somehow messed up her insurance over the last year and she is currently being enrolled for insurance.  If she has been ability to pay to see a physician.  Patient was placed on Lasix and had marked improvement in her symptoms.  She returned quickly back to baseline.  She was started on lisinopril for her elevated blood pressure.  She had a cardiac echo which showed an EF of 35% which is new for her.  I offered patient to see inpatient cardiology as she would probably need a heart catheterization however patient did not wish to continue any hospitalization until she got her insurance figured out and she would follow-up with a primary care physician at which point establish care with a cardiologist.  Again I offered patient inpatient cardiology services which she declined and did not wish to proceed with.  Clinically patient is back to her baseline status.  I stressed the importance of her establishing with a primary care physician and to be evaluated in the next couple weeks by cardiologist she understands this this conversation was taken presence of her aunt.  I am discharging her on lisinopril and Lasix.  30-day supply of each have been  sent to her pharmacy.  Consults: None   Discharge Exam: Blood pressure (!) 161/28, pulse 99, temperature 98.6 F (37 C), temperature source Oral, resp. rate (!) 26, height 5\' 5"  (1.651 m), weight 90.7 kg, last menstrual period 12/22/2021, SpO2 99 %. General appearance: alert and cooperative Neck: no adenopathy, no carotid bruit, no JVD, supple, symmetrical, trachea midline, and thyroid not enlarged, symmetric, no tenderness/mass/nodules Resp: clear to auscultation bilaterally Cardio: regular rate and rhythm, S1, S2 normal, no murmur, click, rub or gallop GI: soft, non-tender; bowel sounds normal; no masses,  no organomegaly Extremities: extremities normal, atraumatic, no cyanosis or edema Pulses: 2+ and symmetric Skin: Skin color, texture, turgor normal. No rashes or lesions  Disposition: Discharge disposition: 01-Home or Self Care       Discharge Instructions     AMB referral to CHF clinic   Complete by: As directed    Diet - low sodium heart healthy   Complete by: As directed    Increase activity slowly   Complete by: As directed       Allergies as of 12/25/2021   No Known Allergies      Medication List     STOP taking these medications    acetaminophen 650 MG CR tablet Commonly known as: TYLENOL   OVER THE COUNTER MEDICATION       TAKE these medications    aspirin EC 81 MG tablet Take 1 tablet (81 mg total) by mouth daily. Swallow whole.   furosemide 20 MG tablet Commonly known as: Lasix Take 1 tablet (20 mg  total) by mouth daily.   lisinopril 10 MG tablet Commonly known as: ZESTRIL Take 1 tablet (10 mg total) by mouth daily.         Signed: Elmyra Banwart A 12/25/2021, 1:53 PM  Acute systolic congestive heart failure

## 2022-06-17 ENCOUNTER — Encounter (HOSPITAL_COMMUNITY): Payer: Self-pay

## 2022-06-17 ENCOUNTER — Inpatient Hospital Stay (HOSPITAL_COMMUNITY)
Admission: EM | Admit: 2022-06-17 | Discharge: 2022-06-20 | DRG: 291 | Disposition: A | Discharge status: Home / Self Care | Payer: Self-pay | Attending: Internal Medicine | Admitting: Internal Medicine

## 2022-06-17 ENCOUNTER — Emergency Department (HOSPITAL_COMMUNITY): Payer: Self-pay

## 2022-06-17 ENCOUNTER — Other Ambulatory Visit: Payer: Self-pay

## 2022-06-17 ENCOUNTER — Inpatient Hospital Stay (HOSPITAL_COMMUNITY): Payer: Self-pay

## 2022-06-17 DIAGNOSIS — F17210 Nicotine dependence, cigarettes, uncomplicated: Secondary | ICD-10-CM | POA: Diagnosis present

## 2022-06-17 DIAGNOSIS — I130 Hypertensive heart and chronic kidney disease with heart failure and stage 1 through stage 4 chronic kidney disease, or unspecified chronic kidney disease: Principal | ICD-10-CM | POA: Diagnosis present

## 2022-06-17 DIAGNOSIS — R7989 Other specified abnormal findings of blood chemistry: Secondary | ICD-10-CM | POA: Insufficient documentation

## 2022-06-17 DIAGNOSIS — D509 Iron deficiency anemia, unspecified: Secondary | ICD-10-CM | POA: Diagnosis present

## 2022-06-17 DIAGNOSIS — Z833 Family history of diabetes mellitus: Secondary | ICD-10-CM

## 2022-06-17 DIAGNOSIS — I10 Essential (primary) hypertension: Secondary | ICD-10-CM | POA: Diagnosis present

## 2022-06-17 DIAGNOSIS — I429 Cardiomyopathy, unspecified: Secondary | ICD-10-CM | POA: Diagnosis present

## 2022-06-17 DIAGNOSIS — D649 Anemia, unspecified: Secondary | ICD-10-CM | POA: Diagnosis present

## 2022-06-17 DIAGNOSIS — N179 Acute kidney failure, unspecified: Secondary | ICD-10-CM | POA: Diagnosis present

## 2022-06-17 DIAGNOSIS — I5022 Chronic systolic (congestive) heart failure: Secondary | ICD-10-CM

## 2022-06-17 DIAGNOSIS — Z720 Tobacco use: Secondary | ICD-10-CM | POA: Diagnosis present

## 2022-06-17 DIAGNOSIS — I5023 Acute on chronic systolic (congestive) heart failure: Secondary | ICD-10-CM

## 2022-06-17 DIAGNOSIS — E876 Hypokalemia: Secondary | ICD-10-CM | POA: Diagnosis present

## 2022-06-17 DIAGNOSIS — Z6836 Body mass index (BMI) 36.0-36.9, adult: Secondary | ICD-10-CM

## 2022-06-17 DIAGNOSIS — Z79899 Other long term (current) drug therapy: Secondary | ICD-10-CM

## 2022-06-17 DIAGNOSIS — Z20822 Contact with and (suspected) exposure to covid-19: Secondary | ICD-10-CM | POA: Diagnosis present

## 2022-06-17 DIAGNOSIS — Z83438 Family history of other disorder of lipoprotein metabolism and other lipidemia: Secondary | ICD-10-CM

## 2022-06-17 DIAGNOSIS — N183 Chronic kidney disease, stage 3 unspecified: Secondary | ICD-10-CM | POA: Diagnosis present

## 2022-06-17 DIAGNOSIS — I5033 Acute on chronic diastolic (congestive) heart failure: Secondary | ICD-10-CM | POA: Insufficient documentation

## 2022-06-17 DIAGNOSIS — E162 Hypoglycemia, unspecified: Secondary | ICD-10-CM | POA: Diagnosis present

## 2022-06-17 DIAGNOSIS — Z91148 Patient's other noncompliance with medication regimen for other reason: Secondary | ICD-10-CM

## 2022-06-17 DIAGNOSIS — Z7982 Long term (current) use of aspirin: Secondary | ICD-10-CM

## 2022-06-17 DIAGNOSIS — I509 Heart failure, unspecified: Principal | ICD-10-CM

## 2022-06-17 LAB — ECHOCARDIOGRAM COMPLETE
AR max vel: 2 cm2
AV Area VTI: 1.89 cm2
AV Area mean vel: 1.86 cm2
AV Mean grad: 5 mmHg
AV Peak grad: 9.9 mmHg
Ao pk vel: 1.57 m/s
Area-P 1/2: 3.53 cm2
Calc EF: 35 %
MV M vel: 4.81 m/s
MV Peak grad: 92.5 mmHg
MV VTI: 1.74 cm2
P 1/2 time: 596 msec
Radius: 0.6 cm
S' Lateral: 4.9 cm
Single Plane A2C EF: 28.8 %
Single Plane A4C EF: 43 %

## 2022-06-17 LAB — CBC
HCT: 30.9 % — ABNORMAL LOW (ref 36.0–46.0)
Hemoglobin: 9.2 g/dL — ABNORMAL LOW (ref 12.0–15.0)
MCH: 24.3 pg — ABNORMAL LOW (ref 26.0–34.0)
MCHC: 29.8 g/dL — ABNORMAL LOW (ref 30.0–36.0)
MCV: 81.7 fL (ref 80.0–100.0)
Platelets: 346 10*3/uL (ref 150–400)
RBC: 3.78 MIL/uL — ABNORMAL LOW (ref 3.87–5.11)
RDW: 18.6 % — ABNORMAL HIGH (ref 11.5–15.5)
WBC: 9 10*3/uL (ref 4.0–10.5)
nRBC: 0.2 % (ref 0.0–0.2)

## 2022-06-17 LAB — D-DIMER, QUANTITATIVE: D-Dimer, Quant: 1.38 ug/mL-FEU — ABNORMAL HIGH (ref 0.00–0.50)

## 2022-06-17 LAB — COMPREHENSIVE METABOLIC PANEL
ALT: 15 U/L (ref 0–44)
AST: 25 U/L (ref 15–41)
Albumin: 4.3 g/dL (ref 3.5–5.0)
Alkaline Phosphatase: 61 U/L (ref 38–126)
Anion gap: 10 (ref 5–15)
BUN: 23 mg/dL — ABNORMAL HIGH (ref 6–20)
CO2: 26 mmol/L (ref 22–32)
Calcium: 9.2 mg/dL (ref 8.9–10.3)
Chloride: 102 mmol/L (ref 98–111)
Creatinine, Ser: 1.02 mg/dL — ABNORMAL HIGH (ref 0.44–1.00)
GFR, Estimated: >60 mL/min (ref >60)
Glucose, Bld: 62 mg/dL — ABNORMAL LOW (ref 70–99)
Potassium: 3.6 mmol/L (ref 3.5–5.1)
Sodium: 138 mmol/L (ref 135–145)
Total Bilirubin: 0.5 mg/dL (ref 0.3–1.2)
Total Protein: 7.5 g/dL (ref 6.5–8.1)

## 2022-06-17 LAB — BRAIN NATRIURETIC PEPTIDE: B Natriuretic Peptide: 2710.3 pg/mL — ABNORMAL HIGH (ref 0.0–100.0)

## 2022-06-17 LAB — I-STAT BETA HCG BLOOD, ED (MC, WL, AP ONLY): I-stat hCG, quantitative: <5 m[IU]/mL (ref <5)

## 2022-06-17 MED ORDER — IOHEXOL 350 MG/ML SOLN
75.0000 mL | Freq: Once | INTRAVENOUS | Status: AC | PRN
  Administered 2022-06-17: 80 mL via INTRAVENOUS

## 2022-06-17 MED ORDER — LABETALOL HCL 5 MG/ML IV SOLN
10.0000 mg | Freq: Once | INTRAVENOUS | Status: AC
  Administered 2022-06-17: 10 mg via INTRAVENOUS
  Filled 2022-06-17: qty 4

## 2022-06-17 MED ORDER — FUROSEMIDE 10 MG/ML IJ SOLN
60.0000 mg | Freq: Once | INTRAMUSCULAR | Status: AC
  Administered 2022-06-17: 60 mg via INTRAVENOUS
  Filled 2022-06-17: qty 6

## 2022-06-17 MED ORDER — SODIUM CHLORIDE (PF) 0.9 % IJ SOLN
INTRAMUSCULAR | Status: AC
  Filled 2022-06-17: qty 50

## 2022-06-17 MED ORDER — FUROSEMIDE 10 MG/ML IJ SOLN
60.0000 mg | Freq: Once | INTRAMUSCULAR | Status: AC
  Administered 2022-06-17: 60 mg via INTRAVENOUS
  Filled 2022-06-17: qty 8

## 2022-06-17 NOTE — ED Provider Notes (Signed)
[facility]-EMERGENCY DEPT Provider Note   CSN: [id number] Arrival date & time: 06/17/22  3614     History  Chief Complaint  Patient presents with   Shortness of Breath    Rayola Everhart is a 36 y.o. female.  HPI 36 year old female history of CHF presents today complaining of dyspnea.  She reports that she was in the hospital several months ago and at that time she had CHF and hypertension.  She states that she has not had her medications for some period of time.  She reports that this was secondary to access issues.  She has not been taking any medicines.  She has had increasing peripheral edema and increasing shortness of breath.  She denies fever, chest pain, nausea, vomiting, diarrhea.    Home Medications Prior to Admission medications   Medication Sig Start Date End Date Taking? Authorizing Provider  aspirin EC 81 MG tablet Take 1 tablet (81 mg total) by mouth daily. Swallow whole. 12/25/21 12/25/22  Haydee Monica, MD  furosemide (LASIX) 20 MG tablet Take 1 tablet (20 mg total) by mouth daily. 12/25/21 12/25/22  Haydee Monica, MD  lisinopril (ZESTRIL) 10 MG tablet Take 1 tablet (10 mg total) by mouth daily. 12/25/21   Haydee Monica, MD      Allergies    Patient has no known allergies.    Review of Systems   Review of Systems  Physical Exam Updated Vital Signs BP (!) 153/96   Pulse 90   Temp 97.7 F (36.5 C) (Oral)   Resp (!) 29   SpO2 100%  Physical Exam Vitals and nursing note reviewed.  Constitutional:      General: She is not in acute distress.    Appearance: She is well-developed.  HENT:     Head: Normocephalic.     Mouth/Throat:     Mouth: Mucous membranes are moist.  Eyes:     Pupils: Pupils are equal, round, and reactive to light.  Cardiovascular:     Rate and Rhythm: Regular rhythm. Tachycardia present.  Pulmonary:     Effort: Tachypnea present.     Breath sounds: Examination of the right-lower field reveals decreased breath  sounds. Examination of the left-lower field reveals decreased breath sounds. Decreased breath sounds present.  Abdominal:     General: Bowel sounds are normal.     Palpations: Abdomen is soft.  Musculoskeletal:     Cervical back: Normal range of motion.     Right lower leg: Edema present.     Left lower leg: Edema present.  Skin:    General: Skin is warm and dry.     Capillary Refill: Capillary refill takes less than 2 seconds.  Neurological:     General: No focal deficit present.     Mental Status: She is alert.  Psychiatric:        Mood and Affect: Mood normal.     ED Results / Procedures / Treatments   Labs (all labs ordered are listed, but only abnormal results are displayed) Labs Reviewed  CBC - Abnormal; Notable for the following components:      Result Value   RBC 3.78 (*)    Hemoglobin 9.2 (*)    HCT 30.9 (*)    MCH 24.3 (*)    MCHC 29.8 (*)    RDW 18.6 (*)    All other components within normal limits  COMPREHENSIVE METABOLIC PANEL - Abnormal; Notable for the following components:   Glucose, Bld  62 (*)    BUN 23 (*)    Creatinine, Ser 1.02 (*)    All other components within normal limits  D-DIMER, QUANTITATIVE - Abnormal; Notable for the following components:   D-Dimer, Quant 1.38 (*)    All other components within normal limits  PREGNANCY, URINE  I-STAT BETA HCG BLOOD, ED ([facility], [facility], [facility] ONLY)    EKG EKG Interpretation  Date/Time:  Friday June 17 2022 09:17:26 EDT Ventricular Rate:  93 PR Interval:  158 QRS Duration: 92 QT Interval:  383 QTC Calculation: 477 R Axis:   57 Text Interpretation: Sinus rhythm Probable left atrial enlargement Borderline T abnormalities, lateral leads Confirmed by Margarita Grizzle [phone]) on 06/17/2022 9:41:33 AM  Radiology DG Chest Port 1 View  Result Date: 06/17/2022 CLINICAL DATA:  Pt reports worsening SOB since running out of her lasix and lisinopril in April. Pt has bilateral leg swelling and orthopnea. Chest EXAM: PORTABLE  CHEST 1 VIEW COMPARISON:  Radiograph 12/24/2021 FINDINGS: Stable cardiomediastinal contours with markedly enlarged heart size. Central vascular congestion with is faint diffuse bilateral interstitial opacities likely representing edema. No pneumothorax or definite effusion. No acute finding in the visualized skeleton. IMPRESSION: Cardiomegaly with central vascular congestion and diffuse bilateral interstitial opacities likely representing edema. Electronically Signed   By: Emmaline Kluver M.D.   On: 06/17/2022 09:12    Procedures .Critical Care  Performed by: Margarita Grizzle, MD Authorized by: Margarita Grizzle, MD   Critical care provider statement:    Critical care time (minutes):  45   Critical care was necessary to treat or prevent imminent or life-threatening deterioration of the following conditions:  Circulatory failure and respiratory failure     Medications Ordered in ED Medications  furosemide (LASIX) injection 60 mg (60 mg Intravenous Given 06/17/22 0905)  labetalol (NORMODYNE) injection 10 mg (10 mg Intravenous Given 06/17/22 0905)    ED Course/ Medical Decision Making/ A&P Clinical Course as of 06/17/22 1037  Fri Jun 17, 2022  0928 Cardiomegaly with central vascular congestion and there is bilateral interstitial opacities consistent with edema [DR]  0935 BC reviewed interpreted hemoglobin is low at 9 and this has been trending down with first prior 10 2 months ago and 12 2 years ago [DR]  0958 Hemoglobin 9.2 noted with previous similar although appears to be trending down [DR]  0959 D-dimer, quantitative(!) D-dimer mildly elevated at 1.38 [DR]    Clinical Course User Index [DR] Margarita Grizzle, MD                           Medical Decision Making 36 year old female history of hypertension, CHF, noncompliance with medication presents today with being off of her medicines and having worsening symptoms.  Here in the ED she is tachycardic, tachypneic, sats are normal. Chest x-Parrish Daddario is  obtained and patient has significant cardiomegaly and CHF EKG without evidence of ischemia Mild anemia present Patient treated here in the ED with Lasix and labetalol. Plan admission for ongoing blood pressure control and diuresis. Patient with mildly increased D-dimer which was ordered prior to my evaluation.  I have a low index of suspicion for PE given the other etiologies are much more likely. 1-CHF 2- hypertensive urgency 3- anemia 4 elevated d-dimer Discussed care with Ronaldo Miyamoto who accepts for admission  Amount and/or Complexity of Data Reviewed External Data Reviewed:     Details: Discharged Condition: stable   Hospital Course: 36 year old female with a history of hypertension, obesity comes  in with dyspnea on exertion and worsening shortness of breath found to have lower extremity edema and new onset congestive heart failure.  Patient has longstanding uncontrolled blood pressure and is noncompliant with medications.  She reports that her work somehow messed up her insurance over the last year and she is currently being enrolled for insurance.  If she has been ability to pay to see a physician.  Patient was placed on Lasix and had marked improvement in her symptoms.  She returned quickly back to baseline.  She was started on lisinopril for her elevated blood pressure.  She had a cardiac echo which showed an EF of 35% which is new for her.  I offered patient to see inpatient cardiology as she would probably need a heart catheterization however patient did not wish to continue any hospitalization until she got her insurance figured out and she would follow-up with a primary care physician at which point establish care with a cardiologist.  Again I offered patient inpatient cardiology services which she declined and did not wish to proceed with.  Clinically patient is back to her baseline status.  I stressed the importance of her establishing with a primary care physician and to be evaluated in the  next couple weeks by cardiologist she understands this this conversation was taken presence of her aunt.  I am discharging her on lisinopril and Lasix.  30-day supply of each have been sent to her pharmacy. Labs: ordered. Decision-making details documented in ED Course. Radiology: ordered and independent interpretation performed. Decision-making details documented in ED Course.  Risk Prescription drug management.           Final Clinical Impression(s) / ED Diagnoses Final diagnoses:  Congestive heart failure, unspecified HF chronicity, unspecified heart failure type [facility])    Rx / DC Orders ED Discharge Orders     None         Margarita Grizzle, MD 06/17/22 1037

## 2022-06-17 NOTE — H&P (Signed)
History and Physical    Patient: Robin Dudley PPI:[id number] DOB: 1986-03-02 DOA: 06/17/2022 DOS: the patient was seen and examined on 06/17/2022 PCP: Patient, No Pcp Per  Patient coming from: Home  Chief Complaint:  Chief Complaint  Patient presents with   Shortness of Breath   HPI: Robin Dudley is a 36 y.o. female with medical history significant of HFrEF, HTN, tobacco abuse. Presenting with dyspnea and lower extremity swelling. She has noticed she has been more short of breath with activity over the last couple of months. She correlates onset with when she ran out of her BP and HF meds. She has just dealt with it. She slows down she she needs to and rests when she needs to. Over the last week, she has noticed an increase in the swelling in her legs. She tried keeping her feet elevated and she tried compression socks. Neither of these therapies helped. Over the last day, her DOE felt worse. There was no chest pain. She just felt she couldn't catch up. When her symptoms did not improve this morning, she decided to go to the ED for assistance.     Review of Systems: As mentioned in the history of present illness. All other systems reviewed and are negative. Past Medical History:  Diagnosis Date   Hypertension 12/24/2021   PSHx History reviewed. No pertinent surgical history.  Social History:  reports that she has been smoking cigarettes. She has been smoking an average of .5 packs per day. She does not have any smokeless tobacco history on file. She reports current alcohol use. She reports that she does not use drugs.  No Known Allergies  Family History  Family history unknown: Yes    Prior to Admission medications   Medication Sig Start Date End Date Taking? Authorizing Provider  aspirin EC 81 MG tablet Take 1 tablet (81 mg total) by mouth daily. Swallow whole. 12/25/21 12/25/22  Haydee Monica, MD  furosemide (LASIX) 20 MG tablet Take 1 tablet (20 mg total) by mouth daily. 12/25/21  12/25/22  Haydee Monica, MD  lisinopril (ZESTRIL) 10 MG tablet Take 1 tablet (10 mg total) by mouth daily. 12/25/21   Haydee Monica, MD    Physical Exam: Vitals:   06/17/22 0822 06/17/22 0900 06/17/22 0915  BP: (!) 194/140 (!) 168/113 (!) 171/95  Pulse: (!) 120 (!) 106 (!) 115  Resp: 20  (!) 27  Temp: 97.7 F (36.5 C)    TempSrc: Oral    SpO2: 100% 100% 100%   General: 36 y.o. female resting in bed in NAD Eyes: PERRL, normal sclera ENMT: Nares patent w/o discharge, orophaynx clear, dentition normal, ears w/o discharge/lesions/ulcers Neck: Supple, trachea midline Cardiovascular: tachy, +S1, S2, no m/g/r, equal pulses throughout Respiratory: decreased at bases, breathless w/ speech on RA after movement GI: BS+, NDNT, no masses noted, no organomegaly noted MSK: No c/c; BLE 3-4+ edema Neuro: A&O x 3, no focal deficits Psyc: Appropriate interaction and affect, calm/cooperative  Data Reviewed:  Na+  138 K+  3.6 Glucose 62 BUN  23 SCr 1.02 WBC  9.0 Hgb  9.2 Plt  346 D-dimer  1.38  EKG: sinus, no st elevations  Assessment and Plan: Acute on chronic systolic HF     - admit to inpt, progressive     - lasix 60 mg IV BID, cozaar     - echo     - I&Os, daily weight      HTN     -  cozaar  Normocytic anemia     - no evidence of bleed, check iron studies  Elevated d-dimer     - Well's is 4.5, d-dimer 1.38     - checking CTA chest     - reports that she has BLE edema, but right leg always seems to be worse; check venous dopplers  Hypoglycemia     - no history of DM     - diet, trend  Tobacco abuse     - counsel against further use     - nicotine patch  Advance Care Planning:   Code Status: FULL  Consults: None  Family Communication: None at bedside  Severity of Illness: The appropriate patient status for this patient is INPATIENT. Inpatient status is judged to be reasonable and necessary in order to provide the required intensity of service to ensure the  patient's safety. The patient's presenting symptoms, physical exam findings, and initial radiographic and laboratory data in the context of their chronic comorbidities is felt to place them at high risk for further clinical deterioration. Furthermore, it is not anticipated that the patient will be medically stable for discharge from the hospital within 2 midnights of admission.   * I certify that at the point of admission it is my clinical judgment that the patient will require inpatient hospital care spanning beyond 2 midnights from the point of admission due to high intensity of service, high risk for further deterioration and high frequency of surveillance required.*  Author: Teddy Spike, DO 06/17/2022 10:09 AM  For on call review [email or web address].

## 2022-06-17 NOTE — ED Triage Notes (Signed)
Pt reports worsening SOB since running out of her lasix and lisinopril in April. Pt has bilateral leg swelling and orthopnea. Denies CP.

## 2022-06-17 NOTE — Progress Notes (Signed)
*  PRELIMINARY RESULTS* Echocardiogram 2D Echocardiogram has been performed.  Robin Dudley 06/17/2022, 4:17 PM

## 2022-06-18 ENCOUNTER — Inpatient Hospital Stay (HOSPITAL_COMMUNITY): Payer: Self-pay

## 2022-06-18 DIAGNOSIS — D509 Iron deficiency anemia, unspecified: Secondary | ICD-10-CM

## 2022-06-18 DIAGNOSIS — Z720 Tobacco use: Secondary | ICD-10-CM

## 2022-06-18 DIAGNOSIS — E876 Hypokalemia: Secondary | ICD-10-CM

## 2022-06-18 DIAGNOSIS — R609 Edema, unspecified: Secondary | ICD-10-CM

## 2022-06-18 DIAGNOSIS — I10 Essential (primary) hypertension: Secondary | ICD-10-CM

## 2022-06-18 LAB — CBC
HCT: 28.5 % — ABNORMAL LOW (ref 36.0–46.0)
Hemoglobin: 8.7 g/dL — ABNORMAL LOW (ref 12.0–15.0)
MCH: 24.6 pg — ABNORMAL LOW (ref 26.0–34.0)
MCHC: 30.5 g/dL (ref 30.0–36.0)
MCV: 80.5 fL (ref 80.0–100.0)
Platelets: 293 10*3/uL (ref 150–400)
RBC: 3.54 MIL/uL — ABNORMAL LOW (ref 3.87–5.11)
RDW: 18.4 % — ABNORMAL HIGH (ref 11.5–15.5)
WBC: 8.5 10*3/uL (ref 4.0–10.5)
nRBC: 0.4 % — ABNORMAL HIGH (ref 0.0–0.2)

## 2022-06-18 LAB — IRON AND TIBC
Iron: 38 ug/dL (ref 28–170)
Saturation Ratios: 11 % (ref 10.4–31.8)
TIBC: 360 ug/dL (ref 250–450)
UIBC: 322 ug/dL

## 2022-06-18 LAB — COMPREHENSIVE METABOLIC PANEL
ALT: 32 U/L (ref 0–44)
AST: 25 U/L (ref 15–41)
Albumin: 2.9 g/dL — ABNORMAL LOW (ref 3.5–5.0)
Alkaline Phosphatase: 83 U/L (ref 38–126)
Anion gap: 8 (ref 5–15)
BUN: 28 mg/dL — ABNORMAL HIGH (ref 6–20)
CO2: 28 mmol/L (ref 22–32)
Calcium: 8.5 mg/dL — ABNORMAL LOW (ref 8.9–10.3)
Chloride: 105 mmol/L (ref 98–111)
Creatinine, Ser: 1.28 mg/dL — ABNORMAL HIGH (ref 0.44–1.00)
GFR, Estimated: 55 mL/min — ABNORMAL LOW (ref >60)
Glucose, Bld: 102 mg/dL — ABNORMAL HIGH (ref 70–99)
Potassium: 3.3 mmol/L — ABNORMAL LOW (ref 3.5–5.1)
Sodium: 141 mmol/L (ref 135–145)
Total Bilirubin: 0.7 mg/dL (ref 0.3–1.2)
Total Protein: 6.5 g/dL (ref 6.5–8.1)

## 2022-06-18 MED ORDER — FUROSEMIDE 10 MG/ML IJ SOLN
120.0000 mg | Freq: Two times a day (BID) | INTRAVENOUS | Status: DC
  Administered 2022-06-18: 120 mg via INTRAVENOUS
  Filled 2022-06-18 (×2): qty 12

## 2022-06-18 MED ORDER — FUROSEMIDE 10 MG/ML IJ SOLN
60.0000 mg | Freq: Two times a day (BID) | INTRAMUSCULAR | Status: DC
  Administered 2022-06-18 – 2022-06-19 (×2): 60 mg via INTRAVENOUS
  Filled 2022-06-18 (×2): qty 6

## 2022-06-18 MED ORDER — NA FERRIC GLUC CPLX IN SUCROSE 12.5 MG/ML IV SOLN
125.0000 mg | Freq: Every day | INTRAVENOUS | Status: AC
  Administered 2022-06-18 – 2022-06-19 (×2): 125 mg via INTRAVENOUS
  Filled 2022-06-18 (×2): qty 10

## 2022-06-18 MED ORDER — ACETAMINOPHEN 650 MG RE SUPP: 650.0000 mg | Freq: Four times a day (QID) | RECTAL | Status: DC | PRN

## 2022-06-18 MED ORDER — ONDANSETRON HCL 4 MG PO TABS: 4.0000 mg | ORAL_TABLET | Freq: Four times a day (QID) | ORAL | Status: DC | PRN

## 2022-06-18 MED ORDER — ENOXAPARIN SODIUM 60 MG/0.6ML IJ SOSY
50.0000 mg | PREFILLED_SYRINGE | INTRAMUSCULAR | Status: DC
  Administered 2022-06-18 – 2022-06-20 (×3): 50 mg via SUBCUTANEOUS
  Filled 2022-06-18 (×3): qty 0.6

## 2022-06-18 MED ORDER — ACETAMINOPHEN 325 MG PO TABS: 650.0000 mg | ORAL_TABLET | Freq: Four times a day (QID) | ORAL | Status: DC | PRN

## 2022-06-18 MED ORDER — ONDANSETRON HCL 4 MG/2ML IJ SOLN: 4.0000 mg | Freq: Four times a day (QID) | INTRAMUSCULAR | Status: DC | PRN

## 2022-06-18 MED ORDER — LOSARTAN POTASSIUM 25 MG PO TABS
25.0000 mg | ORAL_TABLET | Freq: Every day | ORAL | Status: DC
  Administered 2022-06-18 – 2022-06-20 (×3): 25 mg via ORAL
  Filled 2022-06-18 (×3): qty 1

## 2022-06-18 MED ORDER — NICOTINE 14 MG/24HR TD PT24
14.0000 mg | MEDICATED_PATCH | Freq: Every day | TRANSDERMAL | Status: DC
  Filled 2022-06-18 (×2): qty 1

## 2022-06-18 MED ORDER — SPIRONOLACTONE 25 MG PO TABS
25.0000 mg | ORAL_TABLET | Freq: Every day | ORAL | Status: DC
  Administered 2022-06-18 – 2022-06-19 (×2): 25 mg via ORAL
  Filled 2022-06-18 (×2): qty 1

## 2022-06-18 MED ORDER — FUROSEMIDE 10 MG/ML IJ SOLN: 60.0000 mg | Freq: Two times a day (BID) | INTRAMUSCULAR | Status: DC

## 2022-06-18 MED ORDER — POTASSIUM CHLORIDE CRYS ER 20 MEQ PO TBCR
40.0000 meq | EXTENDED_RELEASE_TABLET | Freq: Two times a day (BID) | ORAL | Status: DC
  Administered 2022-06-18 – 2022-06-20 (×5): 40 meq via ORAL
  Filled 2022-06-18 (×5): qty 2

## 2022-06-18 NOTE — Progress Notes (Signed)
  Progress Note   Patient: Robin Dudley ZJI:[id number] DOB: [birth year] DOA: 06/17/2022     1 DOS: the patient was seen and examined on 06/18/2022        Brief hospital course: Robin Dudley is a 36 y.o. F with HTN, obesity and untreated CHF who presented with several weeks progressive DOE and lower extremity swelling since running out of HF meds.  No prior history HF until Jan 2023 when she presented with CHF symptoms.  Treated with Lasix overnight, echocardiogram showed EF 30-35% which was new.  Cardiology consultation and further work up was recommended, but patient declined and left the hospital.    7/7: Admitted, BNP >2000, CT chest shows edema, cardiomegaly; started on Lasix     Assessment and Plan: * Acute on chronic systolic CHF (congestive heart failure) (HCC) No prior ishcemic disease and no anginal symptoms.  +alcohol.  -cocaine.    EF 30-35%, normal valves - Continue Furosemide 60 mg IV twice a day  - K supplement - Strict I/Os, daily weights, telemetry  - Daily monitoring renal function - Consult Cardiology tomorrow for ?ischemic work up and/or NICM work up   [employer] spironolactone - Continue losartan - Give IV iron    Hypokalemia - Check magnesium - Supplement potassium  Tobacco use Smoking cesation recommended, literature given  Iron deficiency anemia Microcytic anemia with iron saturation 11% only. - IV Iron  Essential hypertension BP still very elevated - Continue losartan - Start spironolactone - Not in shock or near shock it doesn't appear, will plan for Coreg next          Subjective: Still with swelling, still shortness of breath with exertion.  No fever, cough, sputum, confusion     Physical Exam: Vitals:   06/17/22 2119 06/17/22 2319 06/18/22 0332 06/18/22 0725  BP: (!) 180/107 (!) 163/121 (!) 184/110 (!) 179/118  Pulse: 94 96 98 98  Resp: (!) 22 (!) 22 20 20   Temp: 97.7 F (36.5 C) 98.3 F (36.8 C) 98.7 F (37.1 C) 98.3 F  (36.8 C)  TempSrc: Oral Oral Oral Oral  SpO2: 97% 100% 93% 100%  Weight:      Height:       Obese adult female, sitting up in bed, no acute distress, interactive, appropriate Tachycardic, regular, JVP not visible due to body habitus Lower extremity marked swelling, 2+ edema up to the knee Respiratory rate normal, lungs clear without rales or wheezes Abdomen soft no tenderness palpation or guarding Attention normal, affect normal, judgment and insight appear normal  Data Reviewed: Basic metabolic panel notable for creatinine up to 1.2, potassium down to 3.3 BNP 2700 CT angiogram of the chest report reviewed, shows pulmonary edema, cardiomegaly Iron  studies reviewed, saturation low      Disposition: Status is: Inpatient Patient was admitted with new onset congestive heart failure.  She still is markedly overloaded and will require ongoing IV Lasix.  I also recommend cardiology consultation for secondary causes of HF work-up and establishment of care        Author: , MD 06/18/2022 10:52 AM  For on call review [email or web address].

## 2022-06-18 NOTE — Plan of Care (Signed)
  Problem: Education: Goal: Individualized Educational Video(s) Outcome: Progressing   Problem: Activity: Goal: Capacity to carry out activities will improve Outcome: Progressing   Problem: Cardiac: Goal: Ability to achieve and maintain adequate cardiopulmonary perfusion will improve Outcome: Progressing   Problem: Activity: Goal: Capacity to carry out activities will improve Outcome: Progressing

## 2022-06-18 NOTE — Hospital Course (Signed)
Robin Dudley is a 36 y.o. F with HTN, obesity and untreated CHF who presented with several weeks progressive DOE and lower extremity swelling since running out of HF meds.  No prior history HF until Jan 2023 when she presented with CHF symptoms.  Treated with Lasix overnight, echocardiogram showed EF 30-35% which was new.  Cardiology consultation and further work up was recommended, but patient declined and left the hospital.    7/7: Admitted, BNP >2000, CT chest shows edema, cardiomegaly; started on Lasix

## 2022-06-18 NOTE — Assessment & Plan Note (Signed)
BP still very elevated - Continue losartan - Start spironolactone - Not in shock or near shock it doesn't appear, will plan for Coreg next

## 2022-06-18 NOTE — Assessment & Plan Note (Signed)
Smoking cessation recommended, literature given

## 2022-06-18 NOTE — Assessment & Plan Note (Signed)
Microcytic anemia with iron saturation 11% only. Given IV iron

## 2022-06-18 NOTE — Progress Notes (Signed)
Bilateral lower extremity venous duplex completed. Refer to "CV Proc" under chart review to view preliminary results.  06/18/2022 11:02 AM Eula Fried., MHA, RVT, RDCS, RDMS

## 2022-06-18 NOTE — Assessment & Plan Note (Addendum)
No prior ishcemic disease and no anginal symptoms.  +alcohol.  -cocaine.    EF 30-35%, normal valves - Continue Furosemide 60 mg IV twice a day  - K supplement - Strict I/Os, daily weights, telemetry  - Daily monitoring renal function - Consult Cardiology tomorrow for ?ischemic work up and/or NICM work up   - Start spironolactone - Continue losartan - Give IV iron

## 2022-06-18 NOTE — Assessment & Plan Note (Addendum)
Magnesium normal, potassium resolved

## 2022-06-19 DIAGNOSIS — I5021 Acute systolic (congestive) heart failure: Secondary | ICD-10-CM

## 2022-06-19 DIAGNOSIS — I160 Hypertensive urgency: Secondary | ICD-10-CM

## 2022-06-19 LAB — BASIC METABOLIC PANEL
Anion gap: 9 (ref 5–15)
BUN: 27 mg/dL — ABNORMAL HIGH (ref 6–20)
CO2: 27 mmol/L (ref 22–32)
Calcium: 8.3 mg/dL — ABNORMAL LOW (ref 8.9–10.3)
Chloride: 103 mmol/L (ref 98–111)
Creatinine, Ser: 1.5 mg/dL — ABNORMAL HIGH (ref 0.44–1.00)
GFR, Estimated: 45 mL/min — ABNORMAL LOW (ref >60)
Glucose, Bld: 98 mg/dL (ref 70–99)
Potassium: 4.5 mmol/L (ref 3.5–5.1)
Sodium: 139 mmol/L (ref 135–145)

## 2022-06-19 LAB — MAGNESIUM: Magnesium: 1.8 mg/dL (ref 1.7–2.4)

## 2022-06-19 MED ORDER — FUROSEMIDE 40 MG PO TABS
40.0000 mg | ORAL_TABLET | Freq: Every day | ORAL | Status: DC
  Administered 2022-06-20: 40 mg via ORAL
  Filled 2022-06-19: qty 1

## 2022-06-19 MED ORDER — SPIRONOLACTONE 25 MG PO TABS
25.0000 mg | ORAL_TABLET | Freq: Every day | ORAL | Status: DC
  Administered 2022-06-20: 25 mg via ORAL
  Filled 2022-06-19: qty 1

## 2022-06-19 MED ORDER — ISOSORBIDE MONONITRATE ER 30 MG PO TB24
30.0000 mg | ORAL_TABLET | Freq: Every day | ORAL | Status: DC
  Administered 2022-06-19 – 2022-06-20 (×2): 30 mg via ORAL
  Filled 2022-06-19 (×2): qty 1

## 2022-06-19 MED ORDER — SPIRONOLACTONE 12.5 MG HALF TABLET
12.5000 mg | ORAL_TABLET | Freq: Every day | ORAL | Status: DC
Start: 2022-06-20 — End: 2022-06-19

## 2022-06-19 MED ORDER — HYDRALAZINE HCL 25 MG PO TABS
25.0000 mg | ORAL_TABLET | Freq: Two times a day (BID) | ORAL | Status: DC
  Administered 2022-06-19 – 2022-06-20 (×3): 25 mg via ORAL
  Filled 2022-06-19 (×3): qty 1

## 2022-06-19 MED ORDER — CARVEDILOL 6.25 MG PO TABS
6.2500 mg | ORAL_TABLET | Freq: Two times a day (BID) | ORAL | Status: DC
Start: 2022-06-19 — End: 2022-06-20
  Administered 2022-06-19 – 2022-06-20 (×2): 6.25 mg via ORAL
  Filled 2022-06-19 (×2): qty 1

## 2022-06-19 NOTE — Progress Notes (Signed)
  Progress Note   Patient: Robin Dudley AVW:[id number] DOB: 02/26/1986 DOA: 06/17/2022     2 DOS: the patient was seen and examined on 06/19/2022 at 11:17 AM      Brief hospital course: Robin Dudley is a 36 y.o. F with HTN, obesity and untreated CHF who presented with several weeks progressive DOE and lower extremity swelling since running out of HF meds.  No prior history HF until Jan 2023 when she presented with CHF symptoms.  Treated with Lasix overnight, echocardiogram showed EF 30-35% which was new.  Cardiology consultation and further work up was recommended, but patient declined and left the hospital.    7/7: Admitted, BNP >2000, CT chest shows edema, cardiomegaly; started on Lasix     Assessment and Plan: * Acute on chronic systolic CHF (congestive heart failure) (HCC) No prior ischemic disease and no anginal symptoms.  Prominent hypertension.  +alcohol.  -cocaine.    EF 30-35%. Doppler US LE normal Net -5.7 L yesterday, >6 L on admission.  Creatinine trending up to 1.5 - Continue IV furosemide  - K supplement - Strict I/Os, daily weights, telemetry  - Daily monitoring renal function - Consult Cardiology, appreciate expertise - Continue losartan, spironolactone - Start carvedilol, hydralazine, and Imdur    Morbid obesity (HCC) BMI 36 plus comorbid hypertension  Hypokalemia Magnesium normal, potassium resolved  Tobacco use Smoking cessation recommended, literature given  Iron deficiency anemia Microcytic anemia with iron saturation 11% only. Given IV iron  Essential hypertension BP remains somewhat elevated - Continue losartan, spironolactone, carvedilol - Start hydralazine, Imdur          Subjective: Patient still has some swelling in the right leg.  Orthopnea is resolved, dyspnea is improving, no fever, chest pain, abdominal pain.     Physical Exam: Vitals:   06/19/22 0200 06/19/22 0500 06/19/22 0526 06/19/22 1319  BP: (!) 161/109  (!) 149/90  (!) 157/99  Pulse: 100  (!) 102 97  Resp: 20  17 18   Temp: 99.3 F (37.4 C)  99.1 F (37.3 C) 98.5 F (36.9 C)  TempSrc: Oral  Oral Oral  SpO2: 99%  94% 100%  Weight:  100.5 kg    Height:       Obese adult female, lying in bed, no acute distress RRR, soft systolic murmur, JVP seems normal, 2+ edema in the right leg, pitting in the left leg Respiratory rate normal, lungs clear without rales or wheezes Abdomen soft without tenderness palpation or guarding, no ascites or distention Attention normal, affect appropriate, judgment and insight appear normal  Data Reviewed: Basic metabolic panel shows creatinine up to 1.5, sodium 139, potassium 4.5 Lower extremity DVT studies are negative      Disposition: Status is: Inpatient The patient was admitted with congestive heart failure.  This is a new onset diagnosis and cardiology were consulted.  Hopefully we will have 1 more day of diuresis and then home tomorrow  Will need Heart Failure referral by [facility]        Author: [facility], MD 06/19/2022 5:27 PM  For on call review [email or web address].

## 2022-06-19 NOTE — Consult Note (Addendum)
Cardiology Consultation:   Patient ID: Robin Dudley MRN: [id number]; DOB: 1986/06/29  Admit date: 06/17/2022 Date of Consult: 06/19/2022  PCP:  Patient, No Pcp Per   [facility] Providers Cardiologist:  None   {   Patient Profile:   Robin Dudley is a 36 y.o. female with a hx of HTN, chronic systolic heart failure, tobacco use, obesity, who is being seen 06/19/2022 for the evaluation of CHF at the request of Dr Maryfrances Bunnell.  History of Present Illness:   Robin Dudley with above PMH who presented to ER on 06/17/22 with dyspnea, lower extremity swelling, shortness of breath with exertional over the past several month.  She has known history of heart failure.  She reports running out of her blood pressure medication.  She was last hospitalized 12/24/21 to 12/25/2021 for acute systolic heart failure.  She was discharged on Lasix 20 mg daily and lisinopril 10 mg daily. Echo from 12/24/2021 with LVEF 30 to 35%, global hypokinesis, severe concentric LVH, normal RV, severe LAE, moderate to severe mitral regurgitation with EROA 0.3cm2, RVol 48mL. Likely functional due to annular dilation in the setting of severe LA enlargement.  Small pericardial effusion.  Mild to moderate aortic regurgitation.  She does not follow-up with a cardiologist at regular basis. She has no known ischemic workup in the past.   Admission diagnostic revealed hypokalemia 3.3, elevated creatinine 1.28, BUN 28, GFR 55, and albumin 2.9.  BNP 2710.  CBC revealed hemoglobin 8.7.  hCG negative.  CTA chest revealed no acute PE, stable cardiomegaly, airspace filling opacity bilaterally consistent with pulmonary edema.  Repeat BMP today with rising creatinine to 1.5.  She is admitted to hospital medicine service for acute on chronic systolic heart failure, started on IV Lasix 60 mg twice daily.  She has diuresed impressively since admission now 6 L negative.  Echocardiogram completed on 7/ 7/23 with LVEF 30 to 35%, LV global hypokinesis, mildly  dilated LV internal cavity, severe LVH, mildly reduced RV, elevated PASP with RVSP 54 mmHg, severe LAE, mild RAE, moderate mitral regurgitation, mild mitral stenosis, mean mitral valve gradient is 5.0 mmHg with average heart rate of 102 bpm. MVA 1.7cm^2 by continuity equation; mild to moderate tricuspid regurgitation.  Cardiology is consulted today for further input.   Past Medical History:  Diagnosis Date   Hypertension 12/24/2021    History reviewed. No pertinent surgical history.   Home Medications:  Prior to Admission medications   Medication Sig Start Date End Date Taking? Authorizing Provider  acetaminophen (TYLENOL) 650 MG CR tablet Take 1,300 mg by mouth daily as needed for pain (leg pain).   Yes [provider]  APPLE CIDER VINEGAR PO Take 2 tablets by mouth in the morning, at noon, and at bedtime.   Yes [provider]  Multiple Vitamin (MULTIVITAMIN PO) Take 1 tablet by mouth in the morning, at noon, and at bedtime.   Yes [provider]  OVER THE COUNTER MEDICATION Take 1 tablet by mouth 2 (two) times daily as needed (bloat). Olly Beat the Bloat   Yes [provider]  aspirin EC 81 MG tablet Take 1 tablet (81 mg total) by mouth daily. Swallow whole. Patient not taking: Reported on 06/17/2022 12/25/21 12/25/22  Haydee Monica, MD  furosemide (LASIX) 20 MG tablet Take 1 tablet (20 mg total) by mouth daily. Patient not taking: Reported on 06/17/2022 12/25/21 12/25/22  Haydee Monica, MD  lisinopril (ZESTRIL) 10 MG tablet Take 1 tablet (10 mg total) by  mouth daily. Patient not taking: Reported on 06/17/2022 12/25/21   Haydee Monica, MD    Inpatient Medications: Scheduled Meds:  carvedilol  6.25 mg Oral BID WC   enoxaparin (LOVENOX) injection  50 mg Subcutaneous Q24H   furosemide  60 mg Intravenous BID   losartan  25 mg Oral Daily   nicotine  14 mg Transdermal Daily   potassium chloride  40 mEq Oral BID   spironolactone  25 mg Oral Daily    Continuous Infusions:  ferric gluconate (FERRLECIT) IVPB 125 mg (06/18/22 1153)   PRN Meds: acetaminophen **OR** acetaminophen, ondansetron **OR** ondansetron (ZOFRAN) IV  Allergies:   No Known Allergies  Social History:   Social History   Socioeconomic History   Marital status: Single    Spouse name: Not on file   Number of children: Not on file   Years of education: Not on file   Highest education level: Not on file  Occupational History   Not on file  Tobacco Use   Smoking status: Every Day    Packs/day: 0.50    Types: Cigarettes   Smokeless tobacco: Not on file  Vaping Use   Vaping Use: Never used  Substance and Sexual Activity   Alcohol use: Yes    Comment: Social drinker now. Used to drink   Drug use: Yes    Frequency: 7.0 times per week    Types: Marijuana    Comment: pt quit smoking for a year   Sexual activity: Yes  Other Topics Concern   Not on file  Social History Narrative   Not on file   Social Determinants of Health   Financial Resource Strain: Not on file  Food Insecurity: Not on file  Transportation Needs: Not on file  Physical Activity: Not on file  Stress: Not on file  Social Connections: Not on file  Intimate Partner Violence: Not on file    Family History:    Family History  Problem Relation Age of Onset   Diabetes Mother    Hyperlipidemia Mother      ROS:   Pertinent items noted in HPI and remainder of comprehensive ROS otherwise negative.       Physical Exam/Data:   Vitals:   06/18/22 2123 06/19/22 0200 06/19/22 0500 06/19/22 0526  BP: (!) 160/110 (!) 161/109  (!) 149/90  Pulse: (!) 101 100  (!) 102  Resp: 16 20  17   Temp: 98.7 F (37.1 C) 99.3 F (37.4 C)  99.1 F (37.3 C)  TempSrc: Oral Oral  Oral  SpO2: 95% 99%  94%  Weight:   100.5 kg   Height:        Intake/Output Summary (Last 24 hours) at 06/19/2022 0927 Last data filed at 06/18/2022 2156 Gross per 24 hour  Intake 950 ml  Output 6950 ml  Net -6000 ml       06/19/2022    5:00 AM 06/17/2022    7:37 PM 12/24/2021    1:34 AM  Last 3 Weights  Weight (lbs) 221 lb 9.6 oz 235 lb 14.3 oz 200 lb  Weight (kg) 100.517 kg 107 kg 90.719 kg     Body mass index is 36.88 kg/m.   General appearance: alert and no distress Neck: no carotid bruit, no JVD, and thyroid not enlarged, symmetric, no tenderness/mass/nodules Lungs: clear to auscultation bilaterally Heart: regular rate and rhythm, S1, S2 normal, and systolic murmur: holosystolic 3/6, blowing at apex Abdomen: soft, non-tender; bowel sounds normal; no masses,  no  organomegaly Extremities: extremities normal, atraumatic, no cyanosis or edema Pulses: 2+ and symmetric Skin: Skin color, texture, turgor normal. No rashes or lesions Neurologic: Grossly normal Psych: Pleasant   EKG:  The EKG was personally reviewed and demonstrates:    EKG from 06/17/2022 9:17 revealed sinus rhythm with ventricular rate of 93 T wave inversion of V5 and V6, II and aVF. Similar to EKG from 12/24/21.   Telemetry:  Telemetry was personally reviewed and demonstrates:  Sinus rhythm  Relevant CV Studies:  Echocardiogram from 06/17/2022:   1. Left ventricular ejection fraction, by estimation, is 30 to 35%. The  left ventricle has moderately decreased function. The left ventricle  demonstrates global hypokinesis. The left ventricular internal cavity size  was mildly dilated. There is severe  left ventricular hypertrophy. Indeterminate diastolic filling due to E-A  fusion.   2. Right ventricular systolic function is mildly reduced. The right  ventricular size is mildly enlarged. There is moderately elevated  pulmonary artery systolic pressure. The estimated right ventricular  systolic pressure is 54.0 mmHg.   3. Left atrial size was severely dilated.   4. Right atrial size was mildly dilated.   5. The mitral valve is abnormal. Moderate mitral valve regurgitation.  Mild mitral stenosis. The mean mitral valve gradient is  5.0 mmHg with  average heart rate of 102 bpm. MVA 1.7cm^2 by continuity equation.   6. Tricuspid valve regurgitation is mild to moderate.   7. The aortic valve is tricuspid. Aortic valve regurgitation is mild to  moderate. No aortic stenosis is present.   8. The inferior vena cava is dilated in size with >50% respiratory  variability, suggesting right atrial pressure of 8 mmHg.   Echocardiogram from 12/24/2021:   1. Left ventricular ejection fraction, by estimation, is 30 to 35%. The  left ventricle has moderately decreased function. The left ventricle  demonstrates global hypokinesis. The left ventricular internal cavity size  was mildly dilated. There is severe  concentric left ventricular hypertrophy. Indeterminate diastolic filling  due to E-A fusion.   2. Right ventricular systolic function is normal. The right ventricular  size is normal. Tricuspid regurgitation signal is inadequate for assessing  PA pressure.   3. Left atrial size was severely dilated with continuous left to right  bowing of the interatrial septum consistent with elevated LAP.   4. The mitral valve is mildly thickened and calcified. There is  moderate-to-severe mitral regurgitation with EROA 0.3cm2, RVol 48mL.  Likely functional due to annular dilation in the setting of severe LA  enlargement.   5. A small pericardial effusion is present. The pericardial effusion is  anterior to the right ventricle and localized near the right atrium. There  is no evidence of cardiac tamponade.   6. The aortic valve is tricuspid. Aortic valve regurgitation is mild to  moderate. No aortic stenosis is present.   7. The inferior vena cava is dilated in size with >50% respiratory  variability, suggesting right atrial pressure of 8 mmHg.    Laboratory Data:  High Sensitivity Troponin:  No results for input(s): "TROPONINIHS" in the last 720 hours.   Chemistry Recent Labs  Lab 06/17/22 0831 06/18/22 0917 06/19/22 0522  NA  138 141 139  K 3.6 3.3* 4.5  CL 102 105 103  CO2 26 28 27   GLUCOSE 62* 102* 98  BUN 23* 28* 27*  CREATININE 1.02* 1.28* 1.50*  CALCIUM 9.2 8.5* 8.3*  MG  --   --  1.8  GFRNONAA >60 55*  45*  ANIONGAP 10 8 9     Recent Labs  Lab 06/17/22 0831 06/18/22 0917  PROT 7.5 6.5  ALBUMIN 4.3 2.9*  AST 25 25  ALT 15 32  ALKPHOS 61 83  BILITOT 0.5 0.7   Lipids No results for input(s): "CHOL", "TRIG", "HDL", "LABVLDL", "LDLCALC", "CHOLHDL" in the last 168 hours.  Hematology Recent Labs  Lab 06/17/22 0831 06/18/22 0917  WBC 9.0 8.5  RBC 3.78* 3.54*  HGB 9.2* 8.7*  HCT 30.9* 28.5*  MCV 81.7 80.5  MCH 24.3* 24.6*  MCHC 29.8* 30.5  RDW 18.6* 18.4*  PLT 346 293   Thyroid No results for input(s): "TSH", "FREET4" in the last 168 hours.  BNP Recent Labs  Lab 06/17/22 2107  BNP 2,710.3*    DDimer  Recent Labs  Lab 06/17/22 0831  DDIMER 1.38*     Radiology/Studies:  VAS 08/18/22 LOWER EXTREMITY VENOUS (DVT)  Result Date: 06/18/2022  Lower Venous DVT Study Patient Name:  MEREDETH FURBER  Date of Exam:   06/18/2022 Medical Rec #: 08/19/2022      Accession #:    [id number] Date of Birth: 07-04-1986      Patient Gender: F Patient Age:   89 years Exam Location:  [facility] Procedure:      [facility] LOWER EXTREMITY VENOUS (DVT) Referring Phys: [place] --------------------------------------------------------------------------------  Indications: Edema.  Limitations: Poor ultrasound/tissue interface. Comparison Study: 12/24/2021- negative right lower extremity venous duplex Performing Technologist: 12/26/2021 MHA, RDMS, RVT, RDCS  Examination Guidelines: A complete evaluation includes B-mode imaging, spectral Doppler, color Doppler, and power Doppler as needed of all accessible portions of each vessel. Bilateral testing is considered an integral part of a complete examination. Limited examinations for reoccurring indications may be performed as noted. The reflux portion of the exam is  performed with the patient in reverse Trendelenburg.  +---------+---------------+---------+-----------+----------+--------------+ RIGHT    CompressibilityPhasicitySpontaneityPropertiesThrombus Aging +---------+---------------+---------+-----------+----------+--------------+ CFV      Full           No       Yes                                 +---------+---------------+---------+-----------+----------+--------------+ SFJ      Full                                                        +---------+---------------+---------+-----------+----------+--------------+ FV Prox  Full                                                        +---------+---------------+---------+-----------+----------+--------------+ FV Mid   Full                                                        +---------+---------------+---------+-----------+----------+--------------+ FV DistalFull                                                        +---------+---------------+---------+-----------+----------+--------------+  PFV      Full                                                        +---------+---------------+---------+-----------+----------+--------------+ POP      Full           No       Yes                                 +---------+---------------+---------+-----------+----------+--------------+ PTV      Full                                                        +---------+---------------+---------+-----------+----------+--------------+ PERO     Full                                                        +---------+---------------+---------+-----------+----------+--------------+   +---------+---------------+---------+-----------+----------+--------------+ LEFT     CompressibilityPhasicitySpontaneityPropertiesThrombus Aging +---------+---------------+---------+-----------+----------+--------------+ CFV      Full           No       Yes                                  +---------+---------------+---------+-----------+----------+--------------+ SFJ      Full                                                        +---------+---------------+---------+-----------+----------+--------------+ FV Prox  Full                                                        +---------+---------------+---------+-----------+----------+--------------+ FV Mid   Full                                                        +---------+---------------+---------+-----------+----------+--------------+ FV DistalFull                                                        +---------+---------------+---------+-----------+----------+--------------+ PFV      Full                                                        +---------+---------------+---------+-----------+----------+--------------+  POP      Full           No       Yes                                 +---------+---------------+---------+-----------+----------+--------------+ PTV      Full                                                        +---------+---------------+---------+-----------+----------+--------------+ PERO     Full                                                        +---------+---------------+---------+-----------+----------+--------------+     Summary: RIGHT: - There is no evidence of deep vein thrombosis in the lower extremity.  - No cystic structure found in the popliteal fossa.  LEFT: - There is no evidence of deep vein thrombosis in the lower extremity.  - No cystic structure found in the popliteal fossa.  Bilateral lower extremity venous flow is pulsatile, suggestive of possible elevated right heart pressure.  *See table(s) above for measurements and observations. Electronically signed by Gerarda Fraction on 06/18/2022 at 11:23:43 AM.    Final    ECHOCARDIOGRAM COMPLETE  Result Date: 06/17/2022    ECHOCARDIOGRAM REPORT   Patient Name:   BESS SALTZMAN Date of Exam: 06/17/2022  Medical Rec #:  [id number]     Height:       65.0 in Accession #:    [id number]    Weight:       200.0 lb Date of Birth:  1986-08-03     BSA:          1.978 m Patient Age:    36 years      BP:           153/96 mmHg Patient Gender: F             HR:           96 bpm. Exam Location:  Inpatient Procedure: 2D Echo, Cardiac Doppler and Color Doppler Indications:     CHF  History:         Patient has prior history of Echocardiogram examinations, most                  recent 12/24/2021. CHF; Risk Factors:Hypertension and Current                  Smoker.  Sonographer:     Mikki Harbor Referring Phys:  [id number] Teddy Spike Diagnosing Phys: Epifanio Lesches MD IMPRESSIONS  1. Left ventricular ejection fraction, by estimation, is 30 to 35%. The left ventricle has moderately decreased function. The left ventricle demonstrates global hypokinesis. The left ventricular internal cavity size was mildly dilated. There is severe left ventricular hypertrophy. Indeterminate diastolic filling due to E-A fusion.  2. Right ventricular systolic function is mildly reduced. The right ventricular size is mildly enlarged. There is moderately elevated pulmonary artery systolic pressure. The estimated right ventricular systolic pressure is 54.0 mmHg.  3. Left atrial size was severely dilated.  4.  Right atrial size was mildly dilated.  5. The mitral valve is abnormal. Moderate mitral valve regurgitation. Mild mitral stenosis. The mean mitral valve gradient is 5.0 mmHg with average heart rate of 102 bpm. MVA 1.7cm^2 by continuity equation.  6. Tricuspid valve regurgitation is mild to moderate.  7. The aortic valve is tricuspid. Aortic valve regurgitation is mild to moderate. No aortic stenosis is present.  8. The inferior vena cava is dilated in size with >50% respiratory variability, suggesting right atrial pressure of 8 mmHg. FINDINGS  Left Ventricle: Left ventricular ejection fraction, by estimation, is 30 to 35%. The left ventricle has  moderately decreased function. The left ventricle demonstrates global hypokinesis. The left ventricular internal cavity size was mildly dilated. There is severe left ventricular hypertrophy. Indeterminate diastolic filling due to E-A fusion. Right Ventricle: The right ventricular size is mildly enlarged. Right vetricular wall thickness was not well visualized. Right ventricular systolic function is mildly reduced. There is moderately elevated pulmonary artery systolic pressure. The tricuspid  regurgitant velocity is 3.39 m/s, and with an assumed right atrial pressure of 8 mmHg, the estimated right ventricular systolic pressure is 54.0 mmHg. Left Atrium: Left atrial size was severely dilated. Right Atrium: Right atrial size was mildly dilated. Pericardium: Trivial pericardial effusion is present. Mitral Valve: The mitral valve is abnormal. Moderate mitral valve regurgitation. Mild mitral valve stenosis. MV peak gradient, 12.5 mmHg. The mean mitral valve gradient is 5.0 mmHg with average heart rate of 102 bpm. Tricuspid Valve: The tricuspid valve is normal in structure. Tricuspid valve regurgitation is mild to moderate. Aortic Valve: The aortic valve is tricuspid. Aortic valve regurgitation is mild to moderate. Aortic regurgitation PHT measures 596 msec. No aortic stenosis is present. Aortic valve mean gradient measures 5.0 mmHg. Aortic valve peak gradient measures 9.9 mmHg. Aortic valve area, by VTI measures 1.89 cm. Pulmonic Valve: The pulmonic valve was normal in structure. Pulmonic valve regurgitation is trivial. Aorta: The aortic root and ascending aorta are structurally normal, with no evidence of dilitation. Venous: The inferior vena cava is dilated in size with greater than 50% respiratory variability, suggesting right atrial pressure of 8 mmHg. IAS/Shunts: The interatrial septum was not well visualized.  LEFT VENTRICLE PLAX 2D LVIDd:         5.80 cm LVIDs:         4.90 cm LV PW:         1.90 cm LV IVS:         1.80 cm LVOT diam:     2.00 cm LV SV:         52 LV SV Index:   27 LVOT Area:     3.14 cm  LV Volumes (MOD) LV vol d, MOD A2C: 136.0 ml LV vol d, MOD A4C: 130.0 ml LV vol s, MOD A2C: 96.9 ml LV vol s, MOD A4C: 74.1 ml LV SV MOD A2C:     39.1 ml LV SV MOD A4C:     130.0 ml LV SV MOD BP:      48.0 ml RIGHT VENTRICLE RV Basal diam:  3.85 cm RV Mid diam:    2.70 cm RV S prime:     14.40 cm/s TAPSE (M-mode): 1.8 cm LEFT ATRIUM              Index        RIGHT ATRIUM           Index LA diam:        5.10 cm  2.58 cm/m   RA Area:     25.10 cm LA Vol (A2C):   152.0 ml 76.84 ml/m  RA Volume:   70.90 ml  35.84 ml/m LA Vol (A4C):   151.0 ml 76.33 ml/m LA Biplane Vol: 160.0 ml 80.88 ml/m  AORTIC VALVE                     PULMONIC VALVE AV Area (Vmax):    2.00 cm      PV Vmax:       0.77 m/s AV Area (Vmean):   1.86 cm      PV Peak grad:  2.4 mmHg AV Area (VTI):     1.89 cm AV Vmax:           157.00 cm/s AV Vmean:          106.000 cm/s AV VTI:            0.278 m AV Peak Grad:      9.9 mmHg AV Mean Grad:      5.0 mmHg LVOT Vmax:         99.80 cm/s LVOT Vmean:        62.600 cm/s LVOT VTI:          0.167 m LVOT/AV VTI ratio: 0.60 AI PHT:            596 msec  AORTA Ao Root diam: 2.80 cm Ao Asc diam:  3.40 cm MITRAL VALVE                  TRICUSPID VALVE MV Area (PHT): 3.53 cm       TR Peak grad:   46.0 mmHg MV Area VTI:   1.74 cm       TR Vmax:        339.00 cm/s MV Peak grad:  12.5 mmHg MV Mean grad:  5.0 mmHg       SHUNTS MV Vmax:       1.77 m/s       Systemic VTI:  0.17 m MV Vmean:      101.0 cm/s     Systemic Diam: 2.00 cm MV Decel Time: 215 msec MR Peak grad:    92.5 mmHg MR Mean grad:    63.0 mmHg MR Vmax:         481.00 cm/s MR Vmean:        366.0 cm/s MR PISA:         2.26 cm MR PISA Eff ROA: 43 mm MR PISA Radius:  0.60 cm MV E velocity: 125.00 cm/s Epifanio Lesches MD Electronically signed by Epifanio Lesches MD Signature Date/Time: 06/17/2022/7:43:43 PM    Final (Updated)    CT Angio Chest Pulmonary  Embolism (PE) W or WO Contrast  Result Date: 06/17/2022 CLINICAL DATA:  Pulmonary embolism suspected. Positive D-dimer. Bilateral leg swelling and orthopnea. Patient ran out of Lasix and lisinopril. EXAM: CT ANGIOGRAPHY CHEST WITH CONTRAST TECHNIQUE: Multidetector CT imaging of the chest was performed using the standard protocol during bolus administration of intravenous contrast. Multiplanar CT image reconstructions and MIPs were obtained to evaluate the vascular anatomy. RADIATION DOSE REDUCTION: This exam was performed according to the departmental dose-optimization program which includes automated exposure control, adjustment of the mA and/or kV according to patient size and/or use of iterative reconstruction technique. CONTRAST:  31mL OMNIPAQUE IOHEXOL 350 MG/ML SOLN COMPARISON:  Portable one view chest 06/17/2022, CT angio chest on 12/24/2021 FINDINGS: Cardiovascular: There is diffuse four-chamber enlargement  of the heart. Pulmonary arteries are well opacified and there is no evidence for acute pulmonary embolus. No significant atherosclerosis of the thoracic aorta. No aneurysm. Mediastinum/Nodes: Esophagus is unremarkable. No significant mediastinal, hilar, or axillary adenopathy. The visualized portion of the thyroid gland has a normal appearance. Lungs/Pleura: There are patchy airspace filling opacities throughout the lungs, RIGHT slightly greater than LEFT. In addition, there are focal areas of air trapping, consistent with small vessel disease. Mild interlobular septal thickening. RIGHT pleural effusion. Airways are patent. Upper Abdomen: No acute abnormality. Musculoskeletal: No chest wall abnormality. No acute or significant osseous findings. Review of the MIP images confirms the above findings. IMPRESSION: 1. Technically adequate exam showing no acute pulmonary embolus. 2. Stable, marked cardiomegaly. 3. Airspace filling opacities bilaterally, associated interlobular septal thickening. Findings are  consistent with pulmonary edema. Electronically Signed   By: Norva Pavlov M.D.   On: 06/17/2022 12:31   DG Chest Port 1 View  Result Date: 06/17/2022 CLINICAL DATA:  Pt reports worsening SOB since running out of her lasix and lisinopril in April. Pt has bilateral leg swelling and orthopnea. Chest EXAM: PORTABLE CHEST 1 VIEW COMPARISON:  Radiograph 12/24/2021 FINDINGS: Stable cardiomediastinal contours with markedly enlarged heart size. Central vascular congestion with is faint diffuse bilateral interstitial opacities likely representing edema. No pneumothorax or definite effusion. No acute finding in the visualized skeleton. IMPRESSION: Cardiomegaly with central vascular congestion and diffuse bilateral interstitial opacities likely representing edema. Electronically Signed   By: Emmaline Kluver M.D.   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 - BNP 2710 - CTA with pulmonary edema - Cr rising from 1.02 >1.28 >1.5 -Echo from 06/17/2022 with LVEF 30 to 35% , severe LVH, mildly reduced RV, RVSP 54 mmHg, severe LAE, mild RAE, moderate MR, mild MS, mean mitral valve gradient is 5.0 mmHg with average heart rate of 102 bpm. MVA 1.7cm^2 ; mild to moderate TR. - Net -6L since admission,weight is down from 235 to 221 ibs,  clinically she appears euvolemic today. - on IV Lasix 60mg  BID per primary team, recommend, further IV Lasix today and transitioning to p.o. Lasix tomorrow. - GDMT: agree with Coreg 3.25mg  BID and ok to continue low dose losartan and aldactone given uncontrolled hypertension - recommend ARNI in lieu of ARB and SGLT2 inhibitor - insurance is an issue and she is in the process of application.  AKI, nonoliguric - Cr rising from 1.02 >1.28 >1.5, suspect from aggressive  diuresis - appears euvolemic - Will stop IV lasix, reduce aldactone, ok to continue losartan for now - Continue monitor renal index daily  Hypertension -BP remains elevated up to 180/110 over the past 24 hours - also hypokalemic, suspect she may have hyperaldosteronism -Started on carvedilol 6.25 mg twice daily, losartan 25 mg daily, spironolactone 25 mg by primary team -Add hydralazine 25 mg BID and imdur 30 mg daily per A-HEFT data for hypertension/cardiomyopathy (BIDIL will be cost-prohibitive). - as renal function allows, would titrate up aldactone  Thanks for the consultation. Cardiology will follow with you.  For questions or updates, please contact [facility] Please consult [email or web address] for contact info under   , MD, Chrystie Nose  [place]  [facility]  Medical Director of the Advanced Lipid Disorders &  Cardiovascular Risk Reduction  Clinic Diplomate of the American Board of Clinical Lipidology Attending Cardiologist  Direct Dial: [phone]  Fax: [phone]  Website:  [email or web address]

## 2022-06-19 NOTE — Assessment & Plan Note (Signed)
BMI 36 plus comorbid hypertension

## 2022-06-20 ENCOUNTER — Other Ambulatory Visit (HOSPITAL_COMMUNITY): Payer: Self-pay

## 2022-06-20 LAB — CBC
HCT: 30.5 % — ABNORMAL LOW (ref 36.0–46.0)
Hemoglobin: 9.2 g/dL — ABNORMAL LOW (ref 12.0–15.0)
MCH: 24.9 pg — ABNORMAL LOW (ref 26.0–34.0)
MCHC: 30.2 g/dL (ref 30.0–36.0)
MCV: 82.7 fL (ref 80.0–100.0)
Platelets: 299 10*3/uL (ref 150–400)
RBC: 3.69 MIL/uL — ABNORMAL LOW (ref 3.87–5.11)
RDW: 19.2 % — ABNORMAL HIGH (ref 11.5–15.5)
WBC: 10.3 10*3/uL (ref 4.0–10.5)
nRBC: 0.5 % — ABNORMAL HIGH (ref 0.0–0.2)

## 2022-06-20 LAB — BASIC METABOLIC PANEL
Anion gap: 6 (ref 5–15)
BUN: 32 mg/dL — ABNORMAL HIGH (ref 6–20)
CO2: 28 mmol/L (ref 22–32)
Calcium: 8.7 mg/dL — ABNORMAL LOW (ref 8.9–10.3)
Chloride: 107 mmol/L (ref 98–111)
Creatinine, Ser: 1.29 mg/dL — ABNORMAL HIGH (ref 0.44–1.00)
GFR, Estimated: 54 mL/min — ABNORMAL LOW (ref >60)
Glucose, Bld: 99 mg/dL (ref 70–99)
Potassium: 4.3 mmol/L (ref 3.5–5.1)
Sodium: 141 mmol/L (ref 135–145)

## 2022-06-20 MED ORDER — POTASSIUM CHLORIDE CRYS ER 20 MEQ PO TBCR
20.0000 meq | EXTENDED_RELEASE_TABLET | Freq: Every day | ORAL | 0 refills | Status: DC
  Filled 2022-06-20: qty 30, 30d supply, fill #0

## 2022-06-20 MED ORDER — FUROSEMIDE 40 MG PO TABS
40.0000 mg | ORAL_TABLET | Freq: Every day | ORAL | 1 refills | Status: DC
  Filled 2022-06-20: qty 30, 30d supply, fill #0

## 2022-06-20 MED ORDER — LOSARTAN POTASSIUM 25 MG PO TABS
25.0000 mg | ORAL_TABLET | Freq: Every day | ORAL | 1 refills | Status: DC
  Filled 2022-06-20: qty 30, 30d supply, fill #0

## 2022-06-20 MED ORDER — CARVEDILOL 12.5 MG PO TABS: 12.5000 mg | ORAL_TABLET | Freq: Two times a day (BID) | ORAL | Status: DC

## 2022-06-20 MED ORDER — NICOTINE 14 MG/24HR TD PT24
14.0000 mg | MEDICATED_PATCH | Freq: Every day | TRANSDERMAL | 0 refills | Status: DC
  Filled 2022-06-20: qty 28, 28d supply, fill #0

## 2022-06-20 MED ORDER — HYDRALAZINE HCL 25 MG PO TABS
25.0000 mg | ORAL_TABLET | Freq: Two times a day (BID) | ORAL | 1 refills | Status: DC
  Filled 2022-06-20: qty 60, 30d supply, fill #0
  Filled 2022-07-19: qty 60, 30d supply, fill #1

## 2022-06-20 MED ORDER — ISOSORBIDE MONONITRATE ER 30 MG PO TB24
30.0000 mg | ORAL_TABLET | Freq: Every day | ORAL | 1 refills | Status: DC
  Filled 2022-06-20: qty 30, 30d supply, fill #0
  Filled 2022-07-19: qty 30, 30d supply, fill #1

## 2022-06-20 MED ORDER — CARVEDILOL 12.5 MG PO TABS
12.5000 mg | ORAL_TABLET | Freq: Two times a day (BID) | ORAL | 1 refills | Status: DC
  Filled 2022-06-20: qty 60, 30d supply, fill #0
  Filled 2022-07-19: qty 60, 30d supply, fill #1

## 2022-06-20 MED ORDER — POTASSIUM CHLORIDE CRYS ER 20 MEQ PO TBCR: 20.0000 meq | EXTENDED_RELEASE_TABLET | Freq: Every day | ORAL | Status: DC

## 2022-06-20 MED ORDER — SPIRONOLACTONE 25 MG PO TABS
25.0000 mg | ORAL_TABLET | Freq: Every day | ORAL | 1 refills | Status: DC
  Filled 2022-06-20: qty 30, 30d supply, fill #0
  Filled 2022-07-19: qty 30, 30d supply, fill #1

## 2022-06-20 NOTE — TOC Transition Note (Signed)
Transition of Care [facility]) - CM/SW Discharge Note   Patient Details  Name: Robin Dudley MRN: [id number] Date of Birth: 05/16/1986  [facility] CM/SW Contact:  Darleene Cleaver, LCSW Phone Number: 06/20/2022, 3:29 PM   Clinical Narrative:     CSW received consult that patient needs a PCP.  CSW added contact information for [facility] and [facility] on the AVS for her to call and schedule a hospital follow up appointment.  CSW signing off, pleaser reconsult if other social work needs arise.  Final next level of care: Home/Self Care Barriers to Discharge: Barriers Resolved   Patient Goals and CMS Choice Patient states their goals for this hospitalization and ongoing recovery are:: To return back home.      Discharge Placement                       Discharge Plan and Services                                     Social Determinants of Health (SDOH) Interventions     Readmission Risk Interventions     No data to display

## 2022-06-20 NOTE — Discharge Instructions (Signed)
Heart Failure Education: Weigh yourself EVERY morning after you go to the bathroom but before you eat or drink anything. Write this number down in a weight log/diary. If you gain 3 pounds overnight or 5 pounds in a week, call the office. Take your medicines as prescribed. If you have concerns about your medications, please call us before you stop taking them.  Eat low salt foods--Limit salt (sodium) to 2000 mg per day. This will help prevent your body from holding onto fluid. Read food labels as many processed foods have a lot of sodium, especially canned goods and prepackaged meats. If you would like some assistance choosing low sodium foods, we would be happy to set you up with a nutritionist. Limit all fluids for the day to less than 2 liters (64 ounces). Fluid includes all drinks, coffee, juice, ice chips, soup, jello, and all other liquids. Stay as active as you can everyday. Staying active will give you more energy and make your muscles stronger. Start with 5 minutes at a time and work your way up to 30 minutes a day. Break up your activities--do some in the morning and some in the afternoon. Start with 3 days per week and work your way up to 5 days as you can.  If you have chest pain, feel short of breath, dizzy, or lightheaded, STOP. If you don't feel better after a short rest, call 911. If you do feel better, call the office to let us know you have symptoms with exercise.  

## 2022-06-20 NOTE — Discharge Summary (Signed)
Physician Discharge Summary   Patient: Robin Dudley MRN: [id number] DOB: [birth year]  Admit date:     06/17/2022  Discharge date: 06/20/22  Discharge Physician: Clydie Braun   PCP: Patient, No Pcp Per   Recommendations at discharge:   Follow-up and have repeat blood work to check electrolytes  Discharge Diagnoses: Principal Problem:   Acute on chronic systolic CHF (congestive heart failure) (HCC) Active Problems:   Essential hypertension   Iron deficiency anemia   Tobacco use   Hypokalemia   Morbid obesity (HCC)  Resolved Problems:   * No resolved hospital problems. [facility] Course: [name] is a 36 y.o. F with HTN, obesity and untreated CHF who presented with several weeks progressive DOE and lower extremity swelling since running out of HF meds.  No prior history HF until Jan 2023 when she presented with CHF symptoms.  Treated with Lasix overnight, echocardiogram showed EF 30-35% which was new.  Cardiology consultation and further work up was recommended, but patient declined and left the hospital.    7/7: Admitted, BNP >2000, CT chest shows edema, cardiomegaly; started on Lasix  Assessment and Plan: * Acute on chronic systolic CHF (congestive heart failure) (HCC) No prior ischemic disease and no anginal symptoms.  Prominent hypertension.  +alcohol.  -cocaine.    EF 30-35%. Doppler US LE normal Net -5.7 L yesterday, >6 L on admission.  Creatinine trending up to 1.5 - Continue IV furosemide  - K supplement - Strict I/Os, daily weights, telemetry  - Daily monitoring renal function - Consult Cardiology, appreciate expertise - Continue losartan, spironolactone - Start carvedilol, hydralazine, and Imdur    Morbid obesity (HCC) BMI 36 plus comorbid hypertension  Hypokalemia Magnesium normal, potassium resolved  Tobacco use Smoking cessation recommended, literature given  Iron deficiency anemia Microcytic anemia with iron saturation 11% only. Given IV  iron  Essential hypertension BP remains somewhat elevated - Continue losartan, spironolactone, carvedilol - Start hydralazine, Imdur         Consultants: Cardiology Procedures performed: Echocardiogram Disposition: Home Diet recommendation:  Discharge Diet Orders (From admission, onward)     Start     Ordered   06/20/22 0000  Diet - low sodium heart healthy        06/20/22 1301           Cardiac diet DISCHARGE MEDICATION: Allergies as of 06/20/2022   No Known Allergies      Medication List     STOP taking these medications    aspirin EC 81 MG tablet   lisinopril 10 MG tablet Commonly known as: ZESTRIL       TAKE these medications    acetaminophen 650 MG CR tablet Commonly known as: TYLENOL Take 1,300 mg by mouth daily as needed for pain (leg pain).   APPLE CIDER VINEGAR PO Take 2 tablets by mouth in the morning, at noon, and at bedtime.   carvedilol 12.5 MG tablet Commonly known as: COREG Take 1 tablet by mouth 2 (two) times daily with a meal.   furosemide 40 MG tablet Commonly known as: LASIX Take 1 tablet (40 mg total) by mouth daily. Start taking on: June 21, 2022 What changed:  medication strength how much to take   hydrALAZINE 25 MG tablet Commonly known as: APRESOLINE Take 1 tablet (25 mg total) by mouth 2 (two) times daily.   isosorbide mononitrate 30 MG 24 hr tablet Commonly known as: IMDUR Take 1 tablet (30 mg total) by mouth daily. Start taking  on: June 21, 2022   losartan 25 MG tablet Commonly known as: COZAAR Take 1 tablet (25 mg total) by mouth daily. Start taking on: June 21, 2022   MULTIVITAMIN PO Take 1 tablet by mouth in the morning, at noon, and at bedtime.   nicotine 14 mg/24hr patch Commonly known as: NICODERM CQ - dosed in mg/24 hours Place 1 patch (14 mg total) onto the skin daily. Start taking on: June 21, 2022   OVER THE COUNTER MEDICATION Take 1 tablet by mouth 2 (two) times daily as needed (bloat).  Olly Beat the Bloat   potassium chloride SA 20 MEQ tablet Commonly known as: KLOR-CON M Take 1 tablet by mouth daily. Start taking on: June 21, 2022   spironolactone 25 MG tablet Commonly known as: ALDACTONE Take 1 tablet (25 mg total) by mouth daily. Start taking on: June 21, 2022        Follow-up Information     [facility] Follow up.   Specialty: Cardiology Why: Hospital follow-up in our [facility] scheduled for 06/28/2022 at 11:00am. Please arrive 15 minutes early for check-in. This is located at the Heart and [facility] at [facility]. Use Entrance C. Contact information: [address] [id number] mc [address] [place] [address] [phone]        Corrin Parker, PA-C Follow up.   Specialties: Physician Assistant, Cardiology Why: Hospital follow-up with General Cardiology scheduled for 07/21/2022 at 8:00am. Please arrive 15 minutes early for check-in. If this date/time does not work for you, please call our office to reschedule. Contact information: [address] [place] [address] [phone]         [facility] Follow up.   Why: Call to make an appointment for hospital follow up. Contact information: [address] [place] [address] [phone]        [facility] Follow up.   Why: Call to make an appointment for hospital follow up if you prefer this location. Contact information: [address] [place] [address] [phone]               Discharge Exam: Ceasar Mons Weights   06/17/22 1937 06/19/22 0500 06/20/22 0438  Weight: 107 kg 100.5 kg 100.6 kg   Obese female currently in no acute distress Regular rate and rhythm with trace lower extremity edema Respirations clear to auscultation without significant wheezes or rhonchi. Soft abdomen with  positive bowel sounds in all 4 quadrants Alert and oriented x3 with normal judgment and insight.  Condition at discharge: good  The results of significant diagnostics from this hospitalization (including imaging, microbiology, ancillary and laboratory) are listed below for reference.   Imaging Studies: VAS [place] LOWER EXTREMITY VENOUS (DVT)  Result Date: 06/18/2022  Lower Venous DVT Study Patient Name:  TAMYA DENARDO  Date of Exam:   06/18/2022 Medical Rec #: [id number]      Accession #:    [id number] Date of Birth: 1986/02/21      Patient Gender: F Patient Age:   23 years Exam Location:  [facility] Procedure:      VAS [place] LOWER EXTREMITY VENOUS (DVT) Referring Phys: Margie Ege --------------------------------------------------------------------------------  Indications: Edema.  Limitations: Poor ultrasound/tissue interface. Comparison Study: 12/24/2021- negative right lower extremity venous duplex Performing Technologist: Gertie Fey MHA, RDMS, RVT, RDCS  Examination Guidelines: A complete evaluation includes B-mode imaging, spectral Doppler,  color Doppler, and power Doppler as needed of all accessible portions of each vessel. Bilateral testing is considered an integral part of a complete examination. Limited examinations for reoccurring indications may be performed as noted. The reflux portion of the exam is performed with the patient in reverse Trendelenburg.  +---------+---------------+---------+-----------+----------+--------------+ RIGHT    CompressibilityPhasicitySpontaneityPropertiesThrombus Aging +---------+---------------+---------+-----------+----------+--------------+ CFV      Full           No       Yes                                 +---------+---------------+---------+-----------+----------+--------------+ SFJ      Full                                                        +---------+---------------+---------+-----------+----------+--------------+ FV Prox   Full                                                        +---------+---------------+---------+-----------+----------+--------------+ FV Mid   Full                                                        +---------+---------------+---------+-----------+----------+--------------+ FV DistalFull                                                        +---------+---------------+---------+-----------+----------+--------------+ PFV      Full                                                        +---------+---------------+---------+-----------+----------+--------------+ POP      Full           No       Yes                                 +---------+---------------+---------+-----------+----------+--------------+ PTV      Full                                                        +---------+---------------+---------+-----------+----------+--------------+ PERO     Full                                                        +---------+---------------+---------+-----------+----------+--------------+   +---------+---------------+---------+-----------+----------+--------------+  LEFT     CompressibilityPhasicitySpontaneityPropertiesThrombus Aging +---------+---------------+---------+-----------+----------+--------------+ CFV      Full           No       Yes                                 +---------+---------------+---------+-----------+----------+--------------+ SFJ      Full                                                        +---------+---------------+---------+-----------+----------+--------------+ FV Prox  Full                                                        +---------+---------------+---------+-----------+----------+--------------+ FV Mid   Full                                                        +---------+---------------+---------+-----------+----------+--------------+ FV DistalFull                                                         +---------+---------------+---------+-----------+----------+--------------+ PFV      Full                                                        +---------+---------------+---------+-----------+----------+--------------+ POP      Full           No       Yes                                 +---------+---------------+---------+-----------+----------+--------------+ PTV      Full                                                        +---------+---------------+---------+-----------+----------+--------------+ PERO     Full                                                        +---------+---------------+---------+-----------+----------+--------------+     Summary: RIGHT: - There is no evidence of deep vein thrombosis in the lower extremity.  - No cystic structure found in the popliteal fossa.  LEFT: - There is no evidence of deep vein thrombosis in the lower extremity.  -  No cystic structure found in the popliteal fossa.  Bilateral lower extremity venous flow is pulsatile, suggestive of possible elevated right heart pressure.  *See table(s) above for measurements and observations. Electronically signed by Gerarda Fraction on 06/18/2022 at 11:23:43 AM.    Final    ECHOCARDIOGRAM COMPLETE  Result Date: 06/17/2022    ECHOCARDIOGRAM REPORT   Patient Name:   ALEXZA NORBECK Date of Exam: 06/17/2022 Medical Rec #:  [id number]     Height:       65.0 in Accession #:    [id number]    Weight:       200.0 lb Date of Birth:  [birth year]     BSA:          1.978 m Patient Age:    36 years      BP:           153/96 mmHg Patient Gender: F             HR:           96 bpm. Exam Location:  Inpatient Procedure: 2D Echo, Cardiac Doppler and Color Doppler Indications:     CHF  History:         Patient has prior history of Echocardiogram examinations, most                  recent 12/24/2021. CHF; Risk Factors:Hypertension and Current                  Smoker.  Sonographer:     Mikki Harbor Referring  Phys:  [id number] Teddy Spike Diagnosing Phys: Epifanio Lesches MD IMPRESSIONS  1. Left ventricular ejection fraction, by estimation, is 30 to 35%. The left ventricle has moderately decreased function. The left ventricle demonstrates global hypokinesis. The left ventricular internal cavity size was mildly dilated. There is severe left ventricular hypertrophy. Indeterminate diastolic filling due to E-A fusion.  2. Right ventricular systolic function is mildly reduced. The right ventricular size is mildly enlarged. There is moderately elevated pulmonary artery systolic pressure. The estimated right ventricular systolic pressure is 54.0 mmHg.  3. Left atrial size was severely dilated.  4. Right atrial size was mildly dilated.  5. The mitral valve is abnormal. Moderate mitral valve regurgitation. Mild mitral stenosis. The mean mitral valve gradient is 5.0 mmHg with average heart rate of 102 bpm. MVA 1.7cm^2 by continuity equation.  6. Tricuspid valve regurgitation is mild to moderate.  7. The aortic valve is tricuspid. Aortic valve regurgitation is mild to moderate. No aortic stenosis is present.  8. The inferior vena cava is dilated in size with >50% respiratory variability, suggesting right atrial pressure of 8 mmHg. FINDINGS  Left Ventricle: Left ventricular ejection fraction, by estimation, is 30 to 35%. The left ventricle has moderately decreased function. The left ventricle demonstrates global hypokinesis. The left ventricular internal cavity size was mildly dilated. There is severe left ventricular hypertrophy. Indeterminate diastolic filling due to E-A fusion. Right Ventricle: The right ventricular size is mildly enlarged. Right vetricular wall thickness was not well visualized. Right ventricular systolic function is mildly reduced. There is moderately elevated pulmonary artery systolic pressure. The tricuspid  regurgitant velocity is 3.39 m/s, and with an assumed right atrial pressure of 8 mmHg, the  estimated right ventricular systolic pressure is 54.0 mmHg. Left Atrium: Left atrial size was severely dilated. Right Atrium: Right atrial size was mildly dilated. Pericardium: Trivial pericardial effusion is present. Mitral Valve: The mitral valve is abnormal. Moderate mitral  valve regurgitation. Mild mitral valve stenosis. MV peak gradient, 12.5 mmHg. The mean mitral valve gradient is 5.0 mmHg with average heart rate of 102 bpm. Tricuspid Valve: The tricuspid valve is normal in structure. Tricuspid valve regurgitation is mild to moderate. Aortic Valve: The aortic valve is tricuspid. Aortic valve regurgitation is mild to moderate. Aortic regurgitation PHT measures 596 msec. No aortic stenosis is present. Aortic valve mean gradient measures 5.0 mmHg. Aortic valve peak gradient measures 9.9 mmHg. Aortic valve area, by VTI measures 1.89 cm. Pulmonic Valve: The pulmonic valve was normal in structure. Pulmonic valve regurgitation is trivial. Aorta: The aortic root and ascending aorta are structurally normal, with no evidence of dilitation. Venous: The inferior vena cava is dilated in size with greater than 50% respiratory variability, suggesting right atrial pressure of 8 mmHg. IAS/Shunts: The interatrial septum was not well visualized.  LEFT VENTRICLE PLAX 2D LVIDd:         5.80 cm LVIDs:         4.90 cm LV PW:         1.90 cm LV IVS:        1.80 cm LVOT diam:     2.00 cm LV SV:         52 LV SV Index:   27 LVOT Area:     3.14 cm  LV Volumes (MOD) LV vol d, MOD A2C: 136.0 ml LV vol d, MOD A4C: 130.0 ml LV vol s, MOD A2C: 96.9 ml LV vol s, MOD A4C: 74.1 ml LV SV MOD A2C:     39.1 ml LV SV MOD A4C:     130.0 ml LV SV MOD BP:      48.0 ml RIGHT VENTRICLE RV Basal diam:  3.85 cm RV Mid diam:    2.70 cm RV S prime:     14.40 cm/s TAPSE (M-mode): 1.8 cm LEFT ATRIUM              Index        RIGHT ATRIUM           Index LA diam:        5.10 cm  2.58 cm/m   RA Area:     25.10 cm LA Vol (A2C):   152.0 ml 76.84 ml/m  RA  Volume:   70.90 ml  35.84 ml/m LA Vol (A4C):   151.0 ml 76.33 ml/m LA Biplane Vol: 160.0 ml 80.88 ml/m  AORTIC VALVE                     PULMONIC VALVE AV Area (Vmax):    2.00 cm      PV Vmax:       0.77 m/s AV Area (Vmean):   1.86 cm      PV Peak grad:  2.4 mmHg AV Area (VTI):     1.89 cm AV Vmax:           157.00 cm/s AV Vmean:          106.000 cm/s AV VTI:            0.278 m AV Peak Grad:      9.9 mmHg AV Mean Grad:      5.0 mmHg LVOT Vmax:         99.80 cm/s LVOT Vmean:        62.600 cm/s LVOT VTI:          0.167 m LVOT/AV VTI ratio: 0.60 AI PHT:  596 msec  AORTA Ao Root diam: 2.80 cm Ao Asc diam:  3.40 cm MITRAL VALVE                  TRICUSPID VALVE MV Area (PHT): 3.53 cm       TR Peak grad:   46.0 mmHg MV Area VTI:   1.74 cm       TR Vmax:        339.00 cm/s MV Peak grad:  12.5 mmHg MV Mean grad:  5.0 mmHg       SHUNTS MV Vmax:       1.77 m/s       Systemic VTI:  0.17 m MV Vmean:      101.0 cm/s     Systemic Diam: 2.00 cm MV Decel Time: 215 msec MR Peak grad:    92.5 mmHg MR Mean grad:    63.0 mmHg MR Vmax:         481.00 cm/s MR Vmean:        366.0 cm/s MR PISA:         2.26 cm MR PISA Eff ROA: 43 mm MR PISA Radius:  0.60 cm MV E velocity: 125.00 cm/s Epifanio Lescheshristopher Schumann MD Electronically signed by Epifanio Lescheshristopher Schumann MD Signature Date/Time: 06/17/2022/7:43:43 PM    Final (Updated)    CT Angio Chest Pulmonary Embolism (PE) W or WO Contrast  Result Date: 06/17/2022 CLINICAL DATA:  Pulmonary embolism suspected. Positive D-dimer. Bilateral leg swelling and orthopnea. Patient ran out of Lasix and lisinopril. EXAM: CT ANGIOGRAPHY CHEST WITH CONTRAST TECHNIQUE: Multidetector CT imaging of the chest was performed using the standard protocol during bolus administration of intravenous contrast. Multiplanar CT image reconstructions and MIPs were obtained to evaluate the vascular anatomy. RADIATION DOSE REDUCTION: This exam was performed according to the departmental dose-optimization program  which includes automated exposure control, adjustment of the mA and/or kV according to patient size and/or use of iterative reconstruction technique. CONTRAST:  80mL OMNIPAQUE IOHEXOL 350 MG/ML SOLN COMPARISON:  Portable one view chest 06/17/2022, CT angio chest on 12/24/2021 FINDINGS: Cardiovascular: There is diffuse four-chamber enlargement of the heart. Pulmonary arteries are well opacified and there is no evidence for acute pulmonary embolus. No significant atherosclerosis of the thoracic aorta. No aneurysm. Mediastinum/Nodes: Esophagus is unremarkable. No significant mediastinal, hilar, or axillary adenopathy. The visualized portion of the thyroid gland has a normal appearance. Lungs/Pleura: There are patchy airspace filling opacities throughout the lungs, RIGHT slightly greater than LEFT. In addition, there are focal areas of air trapping, consistent with small vessel disease. Mild interlobular septal thickening. RIGHT pleural effusion. Airways are patent. Upper Abdomen: No acute abnormality. Musculoskeletal: No chest wall abnormality. No acute or significant osseous findings. Review of the MIP images confirms the above findings. IMPRESSION: 1. Technically adequate exam showing no acute pulmonary embolus. 2. Stable, marked cardiomegaly. 3. Airspace filling opacities bilaterally, associated interlobular septal thickening. Findings are consistent with pulmonary edema. Electronically Signed   By: Norva PavlovElizabeth  Brown M.D.   On: 06/17/2022 12:31   DG Chest Port 1 View  Result Date: 06/17/2022 CLINICAL DATA:  Pt reports worsening SOB since running out of her lasix and lisinopril in April. Pt has bilateral leg swelling and orthopnea. Chest EXAM: PORTABLE CHEST 1 VIEW COMPARISON:  Radiograph 12/24/2021 FINDINGS: Stable cardiomediastinal contours with markedly enlarged heart size. Central vascular congestion with is faint diffuse bilateral interstitial opacities likely representing edema. No pneumothorax or definite  effusion. No acute finding in the visualized skeleton. IMPRESSION: Cardiomegaly  with central vascular congestion and diffuse bilateral interstitial opacities likely representing edema. Electronically Signed   By: Emmaline Kluver M.D.   On: 06/17/2022 09:12    Microbiology: Results for orders placed or performed during the hospital encounter of 12/24/21  Resp Panel by RT-PCR (Flu A&B, Covid) Nasopharyngeal Swab     Status: None   Collection Time: 12/24/21  1:49 AM   Specimen: Nasopharyngeal Swab; Nasopharyngeal(NP) swabs in vial transport medium  Result Value Ref Range Status   SARS Coronavirus 2 by RT PCR NEGATIVE NEGATIVE Final    Comment: (NOTE) SARS-CoV-2 target nucleic acids are NOT DETECTED.  The SARS-CoV-2 RNA is generally detectable in upper respiratory specimens during the acute phase of infection. The lowest concentration of SARS-CoV-2 viral copies this assay can detect is 138 copies/mL. A negative result does not preclude SARS-Cov-2 infection and should not be used as the sole basis for treatment or other patient management decisions. A negative result may occur with  improper specimen collection/handling, submission of specimen other than nasopharyngeal swab, presence of viral mutation(s) within the areas targeted by this assay, and inadequate number of viral copies(<138 copies/mL). A negative result must be combined with clinical observations, patient history, and epidemiological information. The expected result is Negative.  Fact Sheet for Patients:  BloggerCourse.com  Fact Sheet for Healthcare Providers:  SeriousBroker.it  This test is no t yet approved or cleared by the [place] FDA and  has been authorized for detection and/or diagnosis of SARS-CoV-2 by FDA under an Emergency Use Authorization (EUA). This EUA will remain  in effect (meaning this test can be used) for the duration of the COVID-19 declaration  under Section 564(b)(1) of the Act, 21 U.S.C.section 360bbb-3(b)(1), unless the authorization is terminated  or revoked sooner.       Influenza A by PCR NEGATIVE NEGATIVE Final   Influenza B by PCR NEGATIVE NEGATIVE Final    Comment: (NOTE) The Xpert Xpress SARS-CoV-2/FLU/RSV plus assay is intended as an aid in the diagnosis of influenza from Nasopharyngeal swab specimens and should not be used as a sole basis for treatment. Nasal washings and aspirates are unacceptable for Xpert Xpress SARS-CoV-2/FLU/RSV testing.  Fact Sheet for Patients: BloggerCourse.com  Fact Sheet for Healthcare Providers: SeriousBroker.it  This test is not yet approved or cleared by the [place] FDA and has been authorized for detection and/or diagnosis of SARS-CoV-2 by FDA under an Emergency Use Authorization (EUA). This EUA will remain in effect (meaning this test can be used) for the duration of the COVID-19 declaration under Section 564(b)(1) of the Act, 21 U.S.C. section 360bbb-3(b)(1), unless the authorization is terminated or revoked.  Performed at [facility], [address] [place], [place] [address]     Labs: CBC: Recent Labs  Lab 06/17/22 0831 06/18/22 0917 06/20/22 0720  WBC 9.0 8.5 10.3  HGB 9.2* 8.7* 9.2*  HCT 30.9* 28.5* 30.5*  MCV 81.7 80.5 82.7  PLT 346 293 299   Basic Metabolic Panel: Recent Labs  Lab 06/17/22 0831 06/18/22 0917 06/19/22 0522 06/20/22 0720  NA 138 141 139 141  K 3.6 3.3* 4.5 4.3  CL 102 105 103 107  CO2 26 28 27 28   GLUCOSE 62* 102* 98 99  BUN 23* 28* 27* 32*  CREATININE 1.02* 1.28* 1.50* 1.29*  CALCIUM 9.2 8.5* 8.3* 8.7*  MG  --   --  1.8  --    Liver Function Tests: Recent Labs  Lab 06/17/22 0831 06/18/22 0917  AST  25 25  ALT 15 32  ALKPHOS 61 83  BILITOT 0.5 0.7  PROT 7.5 6.5  ALBUMIN 4.3 2.9*   CBG: No results for input(s): "GLUCAP" in the last 168  hours.  Discharge time spent: greater than 30 minutes.  Signed: Clydie Braun, MD [facility] 06/20/2022

## 2022-06-20 NOTE — Plan of Care (Signed)
  Problem: Activity: Goal: Risk for activity intolerance will decrease Outcome: Adequate for Discharge   Problem: Nutrition: Goal: Adequate nutrition will be maintained Outcome: Adequate for Discharge   Problem: Elimination: Goal: Will not experience complications related to bowel motility Outcome: Adequate for Discharge   Problem: Pain Managment: Goal: General experience of comfort will improve Outcome: Adequate for Discharge   

## 2022-06-20 NOTE — Progress Notes (Signed)
Chaplain engaged in an initial visit with Blessing.  Chaplain responded to consult for an Scientist, water quality, Healthcare POA.  Chaplain provided education.  Lemoyne shared that she doesn't have anyone in her life that she wants to assign as her healthcare agent.  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  Chaplain also showed Mikala how to complete the Advanced Directive document without assigning a healthcare agent but still being able to convey to medical staff her wants and desires if she loses capacity or ability to speak for herself.   Chaplain offered presence, education and support.     06/20/22 1000  Clinical Encounter Type  Visited With Patient  Visit Type Initial;Social support  Spiritual Encounters  Spiritual Needs Literature;Brochure

## 2022-06-20 NOTE — Progress Notes (Signed)
Progress Note  Patient Name: Robin Dudley Date of Encounter: 06/20/2022  [facility] Cardiologist: Pixie Casino, MD   Subjective   No acute overnight events. Patient is doing well this morning. Shortness of breath and lower extremity has improved. Orthopnea has resolved. She feels like her breathing is at baseline. No chest pain or palpitations.  Inpatient Medications    Scheduled Meds:  carvedilol  6.25 mg Oral BID WC   enoxaparin (LOVENOX) injection  50 mg Subcutaneous Q24H   furosemide  40 mg Oral Daily   hydrALAZINE  25 mg Oral BID   isosorbide mononitrate  30 mg Oral Daily   losartan  25 mg Oral Daily   nicotine  14 mg Transdermal Daily   potassium chloride  40 mEq Oral BID   spironolactone  25 mg Oral Daily   Continuous Infusions:  PRN Meds: acetaminophen **OR** acetaminophen, ondansetron **OR** ondansetron (ZOFRAN) IV   Vital Signs    Vitals:   06/19/22 1319 06/19/22 2018 06/20/22 0437 06/20/22 0438  BP: (!) 157/99 (!) 164/106 (!) 141/101   Pulse: 97 100    Resp: 18 (!) 22 16   Temp: 98.5 F (36.9 C) 98 F (36.7 C) 98.1 F (36.7 C)   TempSrc: Oral Oral Oral   SpO2: 100% 95% 95%   Weight:    100.6 kg  Height:        Intake/Output Summary (Last 24 hours) at 06/20/2022 1038 Last data filed at 06/19/2022 2157 Gross per 24 hour  Intake 700 ml  Output 1700 ml  Net -1000 ml      06/20/2022    4:38 AM 06/19/2022    5:00 AM 06/17/2022    7:37 PM  Last 3 Weights  Weight (lbs) 221 lb 12.5 oz 221 lb 9.6 oz 235 lb 14.3 oz  Weight (kg) 100.6 kg 100.517 kg 107 kg      Telemetry   Sinus rhythm with rates in the 80s to low 100s. - Personally Reviewed  ECG    No new ECG tracing today. - Personally Reviewed  Physical Exam   GEN: Obese African-American female in no acute distress.   Neck: No JVD. Cardiac: RRR. No murmurs, rubs, or gallops.  Respiratory: Clear to auscultation bilaterally. No wheezes, rhonchi, or rales. GI: Soft, non-distended, and  non-tender. MS: Trace lower extremity edema bilaterally. No deformity. Skin: Warm and dry. Neuro:  No focal deficits. Psych: Normal affect. Responds appropriately.  Labs    High Sensitivity Troponin:  No results for input(s): "TROPONINIHS" in the last 720 hours.   Chemistry Recent Labs  Lab 06/17/22 0831 06/18/22 0917 06/19/22 0522 06/20/22 0720  NA 138 141 139 141  K 3.6 3.3* 4.5 4.3  CL 102 105 103 107  CO2 26 28 27 28   GLUCOSE 62* 102* 98 99  BUN 23* 28* 27* 32*  CREATININE 1.02* 1.28* 1.50* 1.29*  CALCIUM 9.2 8.5* 8.3* 8.7*  MG  --   --  1.8  --   PROT 7.5 6.5  --   --   ALBUMIN 4.3 2.9*  --   --   AST 25 25  --   --   ALT 15 32  --   --   ALKPHOS 61 83  --   --   BILITOT 0.5 0.7  --   --   GFRNONAA >60 55* 45* 54*  ANIONGAP 10 8 9 6     Lipids No results for input(s): "CHOL", "TRIG", "HDL", "LABVLDL", "LDLCALC", "CHOLHDL"  in the last 168 hours.  Hematology Recent Labs  Lab 06/17/22 0831 06/18/22 0917 06/20/22 0720  WBC 9.0 8.5 10.3  RBC 3.78* 3.54* 3.69*  HGB 9.2* 8.7* 9.2*  HCT 30.9* 28.5* 30.5*  MCV 81.7 80.5 82.7  MCH 24.3* 24.6* 24.9*  MCHC 29.8* 30.5 30.2  RDW 18.6* 18.4* 19.2*  PLT 346 293 299   Thyroid No results for input(s): "TSH", "FREET4" in the last 168 hours.  BNP Recent Labs  Lab 06/17/22 2107  BNP 2,710.3*    DDimer  Recent Labs  Lab 06/17/22 0831  DDIMER 1.38*     Radiology    VAS [place] LOWER EXTREMITY VENOUS (DVT)  Result Date: 06/18/2022  Lower Venous DVT Study Patient Name:  Robin Dudley  Date of Exam:   06/18/2022 Medical Rec #: [id number]      Accession #:    [id number] Date of Birth: 02-15-1986      Patient Gender: F Patient Age:   36 years Exam Location:  [facility] Procedure:      VAS [place] LOWER EXTREMITY VENOUS (DVT) Referring Phys: Cherylann Ratel --------------------------------------------------------------------------------  Indications: Edema.  Limitations: Poor ultrasound/tissue interface. Comparison Study:  12/24/2021- negative right lower extremity venous duplex Performing Technologist: Maudry Mayhew MHA, RDMS, RVT, RDCS  Examination Guidelines: A complete evaluation includes B-mode imaging, spectral Doppler, color Doppler, and power Doppler as needed of all accessible portions of each vessel. Bilateral testing is considered an integral part of a complete examination. Limited examinations for reoccurring indications may be performed as noted. The reflux portion of the exam is performed with the patient in reverse Trendelenburg.  +---------+---------------+---------+-----------+----------+--------------+ RIGHT    CompressibilityPhasicitySpontaneityPropertiesThrombus Aging +---------+---------------+---------+-----------+----------+--------------+ CFV      Full           No       Yes                                 +---------+---------------+---------+-----------+----------+--------------+ SFJ      Full                                                        +---------+---------------+---------+-----------+----------+--------------+ FV Prox  Full                                                        +---------+---------------+---------+-----------+----------+--------------+ FV Mid   Full                                                        +---------+---------------+---------+-----------+----------+--------------+ FV DistalFull                                                        +---------+---------------+---------+-----------+----------+--------------+ PFV      Full                                                        +---------+---------------+---------+-----------+----------+--------------+  POP      Full           No       Yes                                 +---------+---------------+---------+-----------+----------+--------------+ PTV      Full                                                         +---------+---------------+---------+-----------+----------+--------------+ PERO     Full                                                        +---------+---------------+---------+-----------+----------+--------------+   +---------+---------------+---------+-----------+----------+--------------+ LEFT     CompressibilityPhasicitySpontaneityPropertiesThrombus Aging +---------+---------------+---------+-----------+----------+--------------+ CFV      Full           No       Yes                                 +---------+---------------+---------+-----------+----------+--------------+ SFJ      Full                                                        +---------+---------------+---------+-----------+----------+--------------+ FV Prox  Full                                                        +---------+---------------+---------+-----------+----------+--------------+ FV Mid   Full                                                        +---------+---------------+---------+-----------+----------+--------------+ FV DistalFull                                                        +---------+---------------+---------+-----------+----------+--------------+ PFV      Full                                                        +---------+---------------+---------+-----------+----------+--------------+ POP      Full           No       Yes                                 +---------+---------------+---------+-----------+----------+--------------+  PTV      Full                                                        +---------+---------------+---------+-----------+----------+--------------+ PERO     Full                                                        +---------+---------------+---------+-----------+----------+--------------+     Summary: RIGHT: - There is no evidence of deep vein thrombosis in the lower extremity.  - No cystic structure found in  the popliteal fossa.  LEFT: - There is no evidence of deep vein thrombosis in the lower extremity.  - No cystic structure found in the popliteal fossa.  Bilateral lower extremity venous flow is pulsatile, suggestive of possible elevated right heart pressure.  *See table(s) above for measurements and observations. Electronically signed by Gerarda Fraction on 06/18/2022 at 11:23:43 AM.    Final     Cardiac Studies   Echocardiogram 06/17/2022: Impressions:  1. Left ventricular ejection fraction, by estimation, is 30 to 35%. The  left ventricle has moderately decreased function. The left ventricle  demonstrates global hypokinesis. The left ventricular internal cavity size  was mildly dilated. There is severe  left ventricular hypertrophy. Indeterminate diastolic filling due to E-A  fusion.   2. Right ventricular systolic function is mildly reduced. The right  ventricular size is mildly enlarged. There is moderately elevated  pulmonary artery systolic pressure. The estimated right ventricular  systolic pressure is 54.0 mmHg.   3. Left atrial size was severely dilated.   4. Right atrial size was mildly dilated.   5. The mitral valve is abnormal. Moderate mitral valve regurgitation.  Mild mitral stenosis. The mean mitral valve gradient is 5.0 mmHg with  average heart rate of 102 bpm. MVA 1.7cm^2 by continuity equation.   6. Tricuspid valve regurgitation is mild to moderate.   7. The aortic valve is tricuspid. Aortic valve regurgitation is mild to  moderate. No aortic stenosis is present.   8. The inferior vena cava is dilated in size with >50% respiratory  variability, suggesting right atrial pressure of 8 mmHg.    Patient Profile     36 y.o. female with a history of chronic systolic CHF with EF of 30-35%, hypertension, obesity, tobacco abuse, and medication non-compliance who was admitted on 06/17/2022 with acute on chronic CHF after presenting with progressive dyspnea on exertion and lower extremity  edema over the past several months.  Assessment & Plan    Acute on Chronic Systolic CHF Patient presented with progressive dyspnea on exertion over the past several months as well as lower extremity edema. BNP markedly elevated at 2,710. Chest x-ray showed cardiomegaly with central vascular congestion and diffuse bilateral interstitial opacities likely representing edema. Echo showed LVEF of 30-35% with global hypokinesis and severe LVH, mildly enlarged RV with mildly reduced systolic function, severe left atrial enlargement, mild to moderate AI, moderate MR/mild MS, mild to moderate TR, and moderately elevated RVSP of 54.0 mmHg. She was started on IV Lasix with good response. Documented urinary output of 3.5 L yesterday and net negative 8.5 L this admission. Weight 221  lbs today, down 14 lbs from admission. Renal function stable. - Euvolemic on exam. Feeling much better. - Will switch to PO Lasix 40mg  daily (and KCl 20 mEq daily) - Continue Losartan 25mg  daily. Would be a good Entresto candidate but insurance and cost is an issues. Have sent a message to Case Manager for assistance. - Will increase Coreg to 12.5mg  twice daily for additional BP and heart rate control. - Continue Spironolactone 25mg  daily. - Continue Hydralazine 25mg  twice daily and Imdur 30mg  daily. - Can consider adding SGLT2 inhibitor when insurance issues are worked out. - Discussed importance of daily weights and sodium/fluid restrictions after discharge. - Cardiomyopathy strongly felt to be non-ischemic due to uncontrolled hypertension. Discussed with MD and ischemic work-up not felt to be necessary at this time. Will need repeat Echo in about 3 months after optimization of GDMT. If EF still down at that time, would consider ischemic evaluation. - Have arranged close follow-up visit in the [facility] next week. Will need repeat BMET at that time.  Hypertension BP still uncontrolled and ranging from the 140s/90s to  160/100s. - Continue medications for CHF as above. - Of note, she was noted to be hypokalemic on admission so wonder if she could have hyperaldosteronism.  - Consider sleep study as outpatient.  Acute on CKD Stage III Creatinine 1.02 on admission. Peaked at 1.50 yesterday and now back down to 1.29. - Will need repeat BMET at follow-up visit.  For questions or updates, please contact [facility] Please consult [email or web address] for contact info under        Signed, , PA-C  06/20/2022, 10:38 AM

## 2022-06-28 ENCOUNTER — Telehealth (HOSPITAL_COMMUNITY): Payer: Self-pay | Admitting: *Deleted

## 2022-06-28 ENCOUNTER — Ambulatory Visit (HOSPITAL_COMMUNITY)
Admit: 2022-06-28 | Discharge: 2022-06-28 | Disposition: A | Discharge status: Home / Self Care | Payer: Self-pay | Attending: Adult Health | Admitting: Adult Health

## 2022-06-28 ENCOUNTER — Encounter (HOSPITAL_COMMUNITY): Payer: Self-pay

## 2022-06-28 ENCOUNTER — Telehealth (HOSPITAL_COMMUNITY): Payer: Self-pay | Admitting: Pharmacy Technician

## 2022-06-28 ENCOUNTER — Other Ambulatory Visit (HOSPITAL_COMMUNITY): Payer: Self-pay

## 2022-06-28 VITALS — BP 140/98 | HR 91 | Wt 221.0 lb

## 2022-06-28 DIAGNOSIS — I10 Essential (primary) hypertension: Secondary | ICD-10-CM

## 2022-06-28 DIAGNOSIS — I110 Hypertensive heart disease with heart failure: Secondary | ICD-10-CM | POA: Insufficient documentation

## 2022-06-28 DIAGNOSIS — I3139 Other pericardial effusion (noninflammatory): Secondary | ICD-10-CM | POA: Insufficient documentation

## 2022-06-28 DIAGNOSIS — D649 Anemia, unspecified: Secondary | ICD-10-CM | POA: Insufficient documentation

## 2022-06-28 DIAGNOSIS — Z720 Tobacco use: Secondary | ICD-10-CM | POA: Insufficient documentation

## 2022-06-28 DIAGNOSIS — O161 Unspecified maternal hypertension, first trimester: Secondary | ICD-10-CM | POA: Insufficient documentation

## 2022-06-28 DIAGNOSIS — Z79899 Other long term (current) drug therapy: Secondary | ICD-10-CM | POA: Insufficient documentation

## 2022-06-28 DIAGNOSIS — I5022 Chronic systolic (congestive) heart failure: Secondary | ICD-10-CM | POA: Insufficient documentation

## 2022-06-28 DIAGNOSIS — D508 Other iron deficiency anemias: Secondary | ICD-10-CM

## 2022-06-28 LAB — BASIC METABOLIC PANEL
Anion gap: 8 (ref 5–15)
BUN: 28 mg/dL — ABNORMAL HIGH (ref 6–20)
CO2: 24 mmol/L (ref 22–32)
Calcium: 8.9 mg/dL (ref 8.9–10.3)
Chloride: 107 mmol/L (ref 98–111)
Creatinine, Ser: 1.51 mg/dL — ABNORMAL HIGH (ref 0.44–1.00)
GFR, Estimated: 45 mL/min — ABNORMAL LOW (ref >60)
Glucose, Bld: 84 mg/dL (ref 70–99)
Potassium: 4.5 mmol/L (ref 3.5–5.1)
Sodium: 139 mmol/L (ref 135–145)

## 2022-06-28 MED ORDER — ENTRESTO 49-51 MG PO TABS: 1.0000 | ORAL_TABLET | Freq: Two times a day (BID) | ORAL | 3 refills | Status: DC

## 2022-06-28 MED ORDER — FUROSEMIDE 40 MG PO TABS: 60.0000 mg | ORAL_TABLET | Freq: Every day | ORAL | 3 refills | Status: DC

## 2022-06-28 MED ORDER — ENTRESTO 49-51 MG PO TABS: 1.0000 | ORAL_TABLET | Freq: Two times a day (BID) | ORAL | 0 refills | Status: DC

## 2022-06-28 NOTE — Telephone Encounter (Signed)
Advanced Heart Failure Patient Advocate Encounter  Patient was seen in clinic and is currently uninsured. Sent Entresto assistance application via fax to [employer]. POI is required before approval can be obtained. Document scanned to chart.

## 2022-06-28 NOTE — Telephone Encounter (Signed)
Called to confirm Heart & Vascular Transitions of Care appointment at 11 am on 06/28/22. Patient reminded to bring all medications and pill box organizer with them. Confirmed patient has transportation. Gave directions, instructed to utilize valet parking.  Confirmed appointment prior to ending call.    Rhae Hammock, BSN, Scientist, clinical (histocompatibility and immunogenetics) Only

## 2022-06-28 NOTE — Progress Notes (Signed)
Heart and Vascular Care Navigation  06/28/2022  Robin Dudley [birth year] [id number]  Reason for Referral: Patient seen in HF TOC. Patient is participating in a Managed Medicaid Plan: NO  Engaged with patient face to face for initial visit for Heart and Vascular Care Coordination.                                                                                                   Assessment:  Patient is a 36 yo female who lives in a single family  home with her significant other. 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  There are no children in the home and she has not been deemed disabled so therefore was told that she does not qualify for medicaid. Patient denies any SDoH concerns other than lack of insurance.                                   HRT/VAS Care Coordination     Patients Home Cardiology Office Heart Failure Clinic  [facility]   Outpatient Care Team Social Worker   Social Worker Name: Lasandra Beech, [place] [phone]   Living arrangements for the past 2 months Single Family Home   Lives with: Significant Other   Patient Current Insurance Coverage Self-Pay  Pending commerical insurance   Patient Has Concern With Paying Medical Bills Yes   Does Patient Have Prescription Coverage? No   Home Assistive Devices/Equipment None       Social History:                                                                             SDOH Screenings   Alcohol Screen: Low Risk  (06/28/2022)   Alcohol Screen    Last Alcohol Screening Score (AUDIT): 1  Depression (PHQ2-9): Low Risk  (06/28/2022)   Depression (PHQ2-9)    PHQ-2 Score: 0  Financial Resource Strain: Low Risk  (06/28/2022)   Overall Financial Resource Strain (CARDIA)    Difficulty of Paying Living Expenses: Not hard at all  Food Insecurity: No Food Insecurity (06/28/2022)   Hunger Vital Sign    Worried About Running Out of Food in the Last Year: Never true    Ran Out of Food in the Last Year: Never  true  Housing: Low Risk  (06/28/2022)   Housing    Last Housing Risk Score: 0  Physical Activity: Sufficiently Active (06/28/2022)   Exercise Vital Sign    Days of Exercise per Week: 7 days    Minutes of Exercise per Session: 60 min  Social Connections: Socially Isolated (06/28/2022)   Social Connection and Isolation Panel [NHANES]    Frequency of Communication with Friends and Family: Never  Frequency of Social Gatherings with Friends and Family: Never    Attends Religious Services: Never    Database administrator or Organizations: No    Attends Banker Meetings: Never    Marital Status: Living with partner  Stress: No Stress Concern Present (06/28/2022)   [employer] of Occupational Health - Occupational Stress Questionnaire    Feeling of Stress : Not at all  Tobacco Use: High Risk (06/28/2022)   Patient History    Smoking Tobacco Use: Every Day    Smokeless Tobacco Use: Unknown    Passive Exposure: Not on file  Transportation Needs: No Transportation Needs (06/28/2022)   PRAPARE - Administrator, Civil Service (Medical): No    Lack of Transportation (Non-Medical): No    SDOH Interventions: Financial Resources:    Patient spoke with her HR department and there is a possibility of insurance benefits.  Food Insecurity:   N/a  Housing Insecurity:   N/a  Transportation:    N/a    Follow-up plan:  Patient states that she spoke with her HR department and was informed that she may have health insurance benefits through the employer. She states she was told that she would have to create an account and they would be able to confirm her benefits. Patient plans to complete that process later today and explore possible benefits. CSW discussed options and follow up. Patient will return for follow up appointment and pending outcome with insurance CSW will refer for CAFA application if needed. Lasandra Beech, LCSW, CCSW-MCS [phone]

## 2022-06-28 NOTE — Patient Instructions (Addendum)
Medication Changes:  Stop Losartan.  Start Entresto 49/51 Twice daily tomorrow Wednesday 06/29/2022  Increase your Lasix (furosemide) to 60 mg daily.    Lab Work:  Labs done today, your results will be available in MyChart, we will contact you for abnormal readings.   Testing/Procedures:  Your physician has requested that you have an echocardiogram. Echocardiography is a painless test that uses sound waves to create images of your heart. It provides your doctor with information about the size and shape of your heart and how well your heart's chambers and valves are working. This procedure takes approximately one hour. There are no restrictions for this procedure.   Referrals:  Please follow up with our heart failure pharmacist in 3 weeks   Special Instructions // Education:  Please call us if you have any questions or concerns  Follow-Up in: 4 weeks with the Nurse Practitioner, 3 months with Dr. Gala Romney ** If you have not heard from the office by 3 week of August please call us to arrange your appointment **  At the [facility], you and your health needs are our priority. We have a designated team specialized in the treatment of Heart Failure. This Care Team includes your primary Heart Failure Specialized Cardiologist (physician), Advanced Practice Providers (APPs- Physician Assistants and Nurse Practitioners), and Pharmacist who all work together to provide you with the care you need, when you need it.   You may see any of the following providers on your designated Care Team at your next follow up:  Dr Arvilla Meres Dr Carron Curie, NP Robbie Lis, [place] [facility] [place], [place] Karle Plumber, PharmD   Please be sure to bring in all your medications bottles to every appointment.   Need to Contact us:  If you have any questions or concerns before your next appointment please send [place] a message through [place] or call our  office at [phone].    TO LEAVE A MESSAGE FOR THE NURSE SELECT OPTION 2, PLEASE LEAVE A MESSAGE INCLUDING: YOUR NAME DATE OF BIRTH CALL BACK NUMBER REASON FOR CALL**this is important as we prioritize the call backs  YOU WILL RECEIVE A CALL BACK THE SAME DAY AS LONG AS YOU CALL BEFORE 4:00 PM

## 2022-06-28 NOTE — Progress Notes (Signed)
HEART & VASCULAR TRANSITION OF CARE CONSULT NOTE     Referring Physician: Dr Debara Pickett [facility]: [facility]  Primary Cardiologist:None  HPI: Referred to clinic by Dr Debara Pickett  for heart failure consultation.   Robin Dudley is a 36 year old with h/o HTN, tobacco abuse and chronic HFrEF.   Admitted 6/94/85 for acute systolic heart failure.  She was discharged on Lasix 20 mg daily and lisinopril 10 mg daily. Echo from 12/24/2021 with LVEF 30 to 35%, global hypokinesis, severe concentric LVH, normal RV, severe LAE, moderate to severe mitral regurgitation with EROA 0.3cm2, RVol 74m. Likely functional due to annular dilation in the setting of severe LA enlargement.  Small pericardial effusion.  Mild to moderate aortic regurgitation.  Admitted 06/17/22 with A/C HFrEF. Diuresed with IV lasix and transitioned to lasix 40 mg po daily. Discharged on hydralazine/imdur, losartan, carvedilol, and spiro. Discharge weight 06/20/22.   Overall feeling fine. Denies SOB/PND/Orthopnea.  She have mild SOB with exertion. She has never been on birth control. She is sexually active and eventually would like children.  She has a monthly cycle.  Appetite ok. Eating lots of fast food. No fever or chills. Weight at home 220 pounds. Working full time. Taking all medications. Smoking 10 cigarettes per day. Lives with her boyfriend.    Cardiac Testing  06/2022 Echo Ef 30-35% RV mildly reduced.  12/2021 Echo Ef 30-35% RV normal  Review of Systems: [y] = yes, _0  = no   General: Weight gain _1 ; Weight loss _2 ; Anorexia _3 ; Fatigue _4 ; Fever _5 ; Chills _6 ; Weakness _7   Cardiac: Chest pain/pressure _8 ; Resting SOB _9 ; Exertional SOB [ Y]; Orthopnea _10 ; Pedal Edema _11 ; Palpitations _12 ; Syncope _13 ; Presyncope _14 ; Paroxysmal nocturnal dyspnea_15   Pulmonary: Cough _16 ; Wheezing_17 ; Hemoptysis_18 ; Sputum _19 ; Snoring _20   GI: Vomiting_21 ; Dysphagia_22 ; Melena_23 ; Hematochezia _24 ; Heartburn_25 ; Abdominal pain _26 ; Constipation  _27 ; Diarrhea _28 ; BRBPR _29   GU: Hematuria_30 ; Dysuria _31 ; Nocturia_32   Vascular: Pain in legs with walking _33 ; Pain in feet with lying flat _34 ; Non-healing sores _35 ; Stroke _36 ; TIA _37 ; Slurred speech _38 ;  Neuro: Headaches_39 ; Vertigo_40 ; Seizures_41 ; Paresthesias_42 ;Blurred vision _43 ; Diplopia _44 ; Vision changes _45   Ortho/Skin: Arthritis _46 ; Joint pain _47 ; Muscle pain _48 ; Joint swelling _49 ; Back Pain _50 ; Rash _51   Psych: Depression_52 ; Anxiety_53   Heme: Bleeding problems _54 ; Clotting disorders _55 ; Anemia _56   Endocrine: Diabetes _57 ; Thyroid dysfunction_58    Past Medical History:  Diagnosis Date   Hypertension 12/24/2021    Current Outpatient Medications  Medication Sig Dispense Refill   acetaminophen (TYLENOL) 650 MG CR tablet Take 1,300 mg by mouth daily as needed for pain (leg pain).     carvedilol (COREG) 12.5 MG tablet Take 1 tablet by mouth 2 (two) times daily with a meal. 60 tablet 1   furosemide (LASIX) 40 MG tablet Take 1 tablet (40 mg total) by mouth daily. 30 tablet 1   hydrALAZINE (APRESOLINE) 25 MG tablet Take 1 tablet (25 mg total) by mouth 2 (two) times daily. 60 tablet 1   isosorbide mononitrate (IMDUR) 30 MG 24 hr tablet Take 1 tablet (30 mg total) by mouth daily. 30 tablet 1   losartan (  COZAAR) 25 MG tablet Take 1 tablet (25 mg total) by mouth daily. 30 tablet 1   Multiple Vitamin (MULTIVITAMIN PO) Take 1 tablet by mouth in the morning, at noon, and at bedtime.     OVER THE COUNTER MEDICATION Take 1 tablet by mouth 2 (two) times daily as needed (bloat). Olly Beat the Bloat     potassium chloride SA (KLOR-CON M) 20 MEQ tablet Take 1 tablet by mouth daily. 30 tablet 0   spironolactone (ALDACTONE) 25 MG tablet Take 1 tablet (25 mg total) by mouth daily. 30 tablet 1   No current facility-administered medications for this encounter.    No Known Allergies    Social History   Socioeconomic History   Marital status: Single    Spouse name: Not on file    Number of children: Not on file   Years of education: Not on file   Highest education level: Not on file  Occupational History   Not on file  Tobacco Use   Smoking status: Every Day    Packs/day: 0.50    Types: Cigarettes   Smokeless tobacco: Not on file  Vaping Use   Vaping Use: Never used  Substance and Sexual Activity   Alcohol use: Yes    Comment: Social drinker now. Used to drink   Drug use: Yes    Frequency: 7.0 times per week    Types: Marijuana    Comment: pt quit smoking for a year   Sexual activity: Yes  Other Topics Concern   Not on file  Social History Narrative   Not on file   Social Determinants of Health   Financial Resource Strain: Low Risk  (06/28/2022)   Overall Financial Resource Strain (CARDIA)    Difficulty of Paying Living Expenses: Not hard at all  Food Insecurity: No Food Insecurity (06/28/2022)   Hunger Vital Sign    Worried About Running Out of Food in the Last Year: Never true    Ran Out of Food in the Last Year: Never true  Transportation Needs: No Transportation Needs (06/28/2022)   PRAPARE - Hydrologist (Medical): No    Lack of Transportation (Non-Medical): No  Physical Activity: Sufficiently Active (06/28/2022)   Exercise Vital Sign    Days of Exercise per Week: 7 days    Minutes of Exercise per Session: 60 min  Stress: No Stress Concern Present (06/28/2022)   [place]    Feeling of Stress : Not at all  Social Connections: Socially Isolated (06/28/2022)   Social Connection and Isolation Panel [NHANES]    Frequency of Communication with Friends and Family: Never    Frequency of Social Gatherings with Friends and Family: Never    Attends Religious Services: Never    Marine scientist or Organizations: No    Attends Music therapist: Never    Marital Status: Living with partner  Intimate Partner Violence: Not on file       Family History  Problem Relation Age of Onset   Diabetes Mother    Hyperlipidemia Mother     Vitals:   06/28/22 1112  BP: (!) 140/98  Pulse: 91  SpO2: 100%  Weight: 100.2 kg (221 lb)    PHYSICAL EXAM: General:  Walked in the clinic. No respiratory difficulty HEENT: normal Neck: supple. JVP 11-12 . Carotids 2+ bilat; no bruits. No lymphadenopathy or thryomegaly appreciated. Cor: PMI nondisplaced. Regular rate & rhythm.  No rubs, gallops. 2/6 MR. Lungs: clear Abdomen: soft, nontender, nondistended. No hepatosplenomegaly. No bruits or masses. Good bowel sounds. Extremities: no cyanosis, clubbing, rash, edema Neuro: alert & oriented x 3, cranial nerves grossly intact. moves all 4 extremities w/o difficulty. Affect pleasant.  ECG: NSR 84 bpm QRS 80 Robin    ASSESSMENT & PLAN: 1. Chronic HFrEF Echo EF 30-35% RV mildly reduced . LVH. LA severely dilated. RA mildly reduced.  Unclear etiology though hypertension will certainly play a role. She has not had an ischemic evaluation. Hold off on cath for now. If EF remains low will need to consider.  We have discussed the fetotoxic risks of her cardiac meds were she to become pregnant.  She is sexually active and currently not trying to prevent pregnancy. For now her boyfriend plans to use condoms.  NYHA II-III. Reds Clip 50%. Volume status elevated.  GDMT  Diuretic-Increase lasix to 60 mg daily  BB-Continue carvedilol 12.5 mg twice a day. Ace/ARB/ARNI- Stop losartan. Switch to entresto 49-51 mg twice a day . Check BMET today and in 2 weeks.  MRA- Continue spiro 25 mg daily  SGLT2i- No for now with UTIs.  - Continue hydralazine 25 mg twice a day  - Continue imdur 30 mg daily  -Unable to obtain cardiac MRI due to lack of insurance.  - Plan to repeat ECHO in 3 months after HF optimized.  2.Anemia  06/20/22 Hgb 9.2. Check anemia panel next visit.               3. HTN  Elevated. Switch from losartan to entresto.   4. Tobacco  Abuse Discussed cessation.   Referred to HFSW (PCP, Medications, Insurance, Financial ): Yes She met with Kennyth Lose in the clinic today. Please see her note. . Will need help getting PCP.  Refer to Pharmacy: Yes Refer to Home Health:  No Refer to Advanced Heart Failure Clinic: Yes - Dr Haroldine Laws  Refer to General Cardiology:  Shared.   Follow up 2 weeks with pharmacy . Repeat BMET at that time.  Follow up 4 weeks with APP  Follow up 12 weeks with Dr Haroldine Laws and an ECHO.   Robin Chumley NP-C  1:18 PM

## 2022-06-28 NOTE — Progress Notes (Signed)
ReDS Vest / Clip - 06/28/22 1200       ReDS Vest / Clip   Station Marker A    Ruler Value 29.5    ReDS Value Range High volume overload    ReDS Actual Value 50    Anatomical Comments sitting

## 2022-06-30 IMAGING — CT CT ANGIO CHEST
2 of 6 series · 18 of 36 positions shown · IV contrast (OMNIPAQUE 350)
Comparison: None.

CLINICAL DATA: Positive D-dimer with pulmonary embolism suspected.
Back pain and shortness of breath for 2 weeks negative COVID and flu
tests recently

EXAM:
CT ANGIOGRAPHY CHEST WITH CONTRAST
TECHNIQUE: Multidetector CT imaging of the chest was performed using the
standard protocol during bolus administration of intravenous
contrast. Multiplanar CT image reconstructions and MIPs were
obtained to evaluate the vascular anatomy.

[Series 6: thins · axial · 0.62mm/px · z∈[+1531,+1768]mm · 17 of 267 slices shown]
[im 15/267  lung]
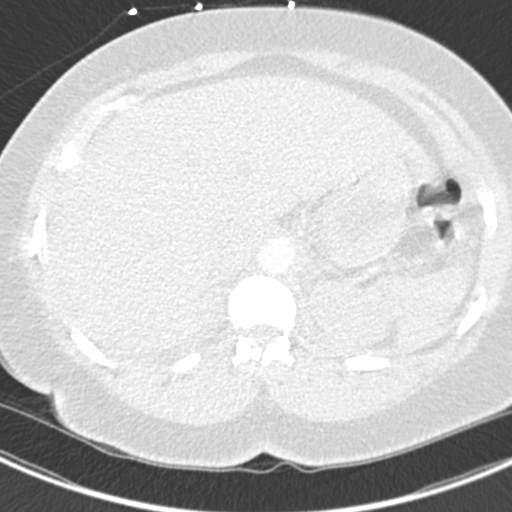
[im 30/267  mediastinal]
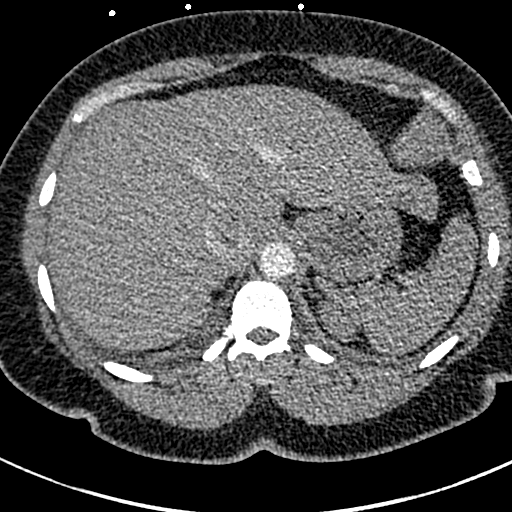
[im 45/267  lung]
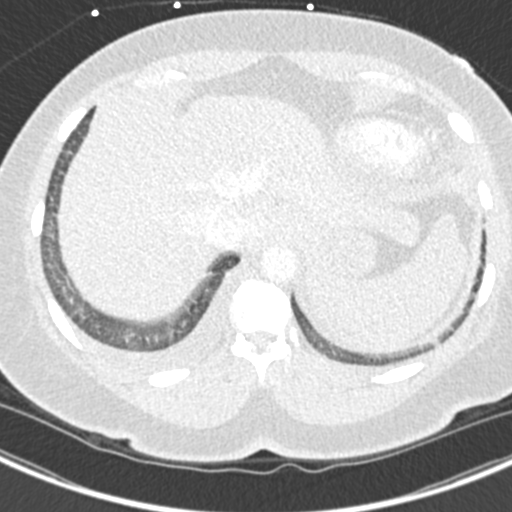
[im 60/267  mediastinal]
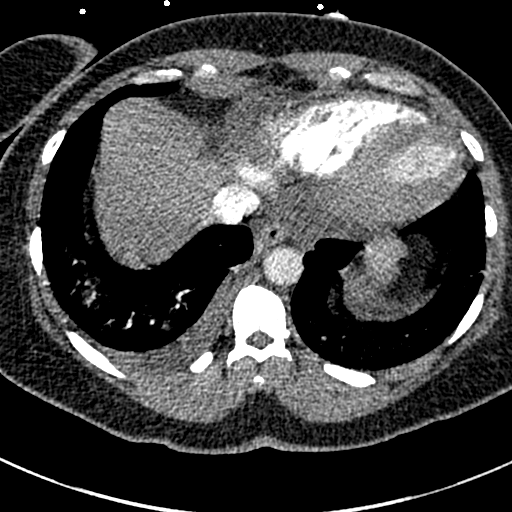
[im 74/267  lung]
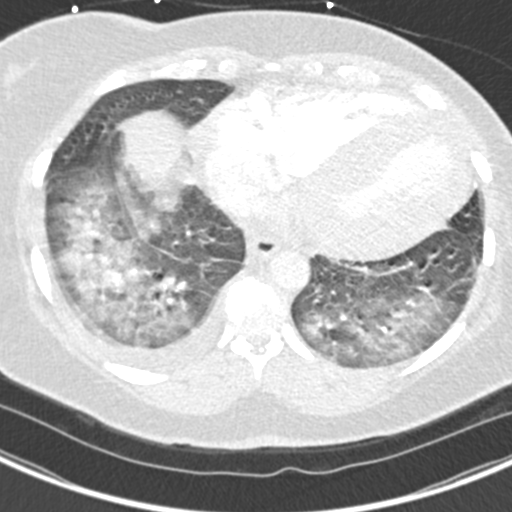
[im 89/267  mediastinal]
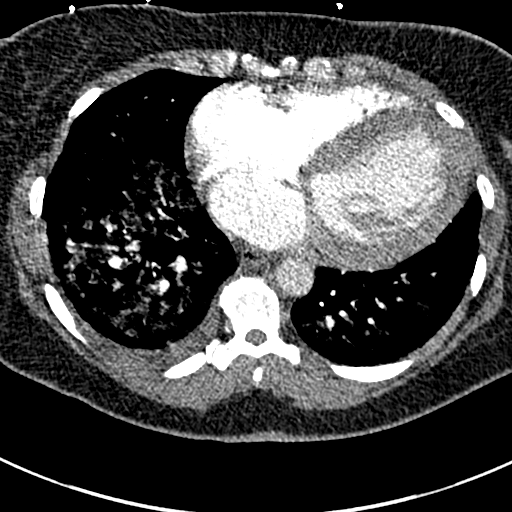
[im 104/267  lung]
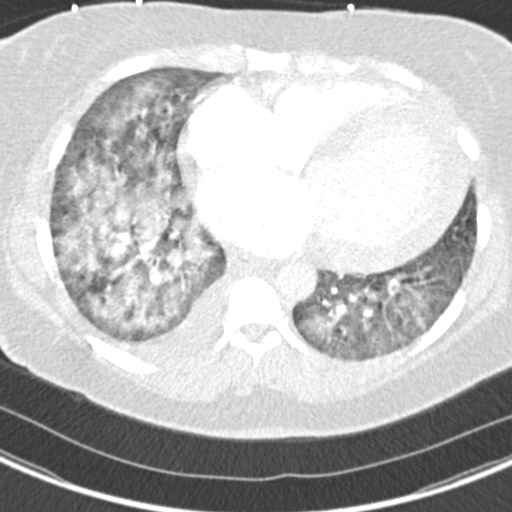
[im 119/267  mediastinal]
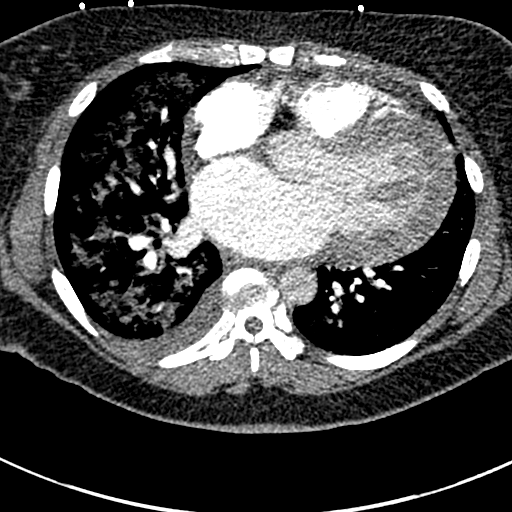
[im 134/267  lung]
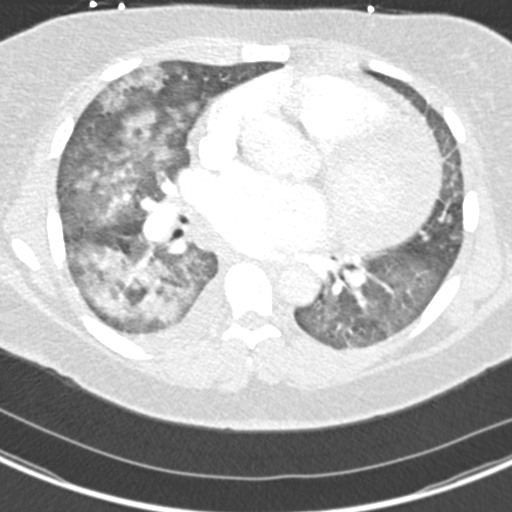
[im 148/267  mediastinal]
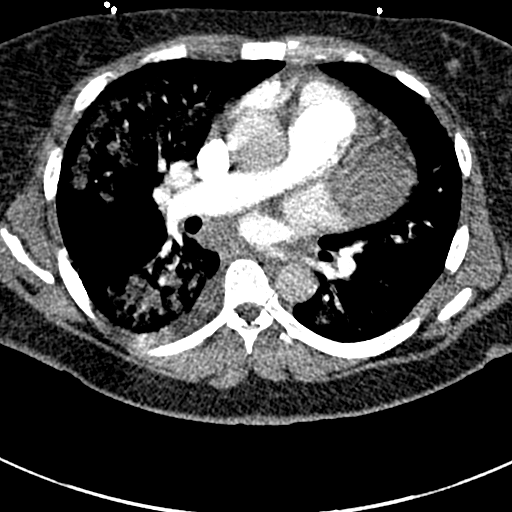
[im 163/267  lung]
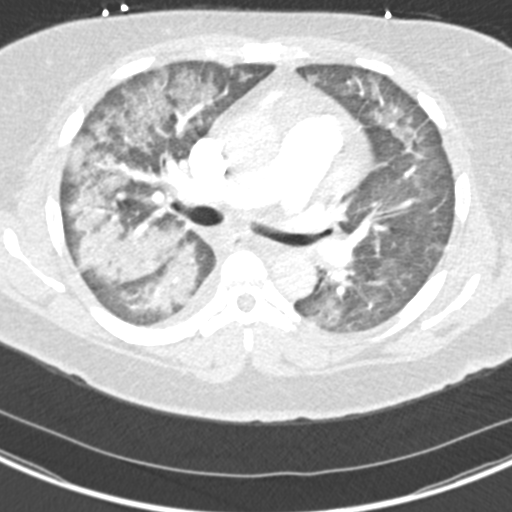
[im 178/267  mediastinal]
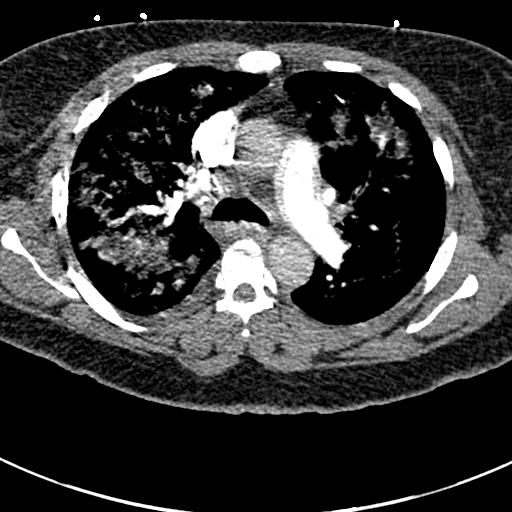
[im 193/267  lung]
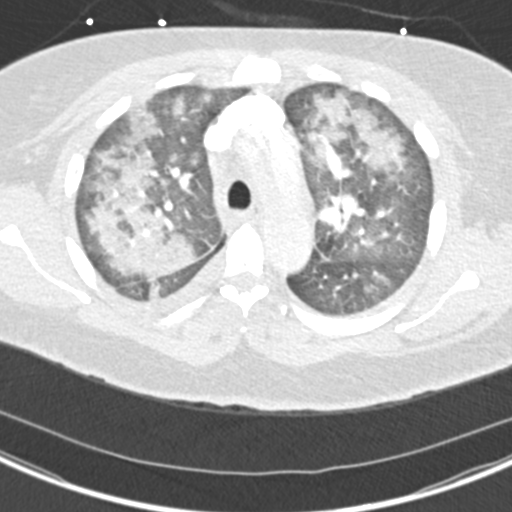
[im 207/267  mediastinal]
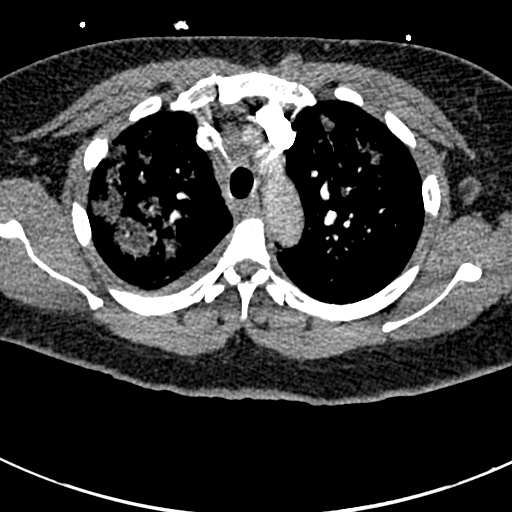
[im 222/267  lung]
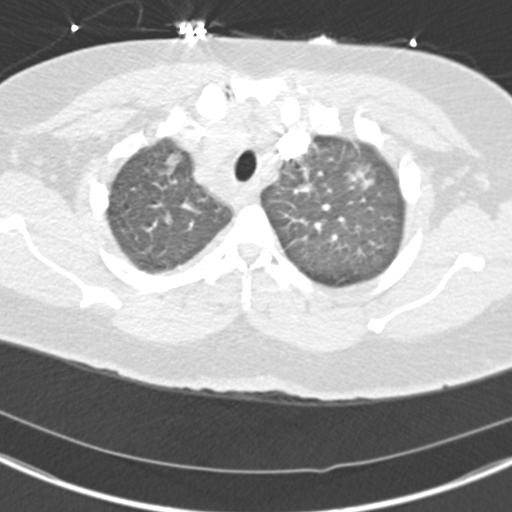
[im 237/267  mediastinal]
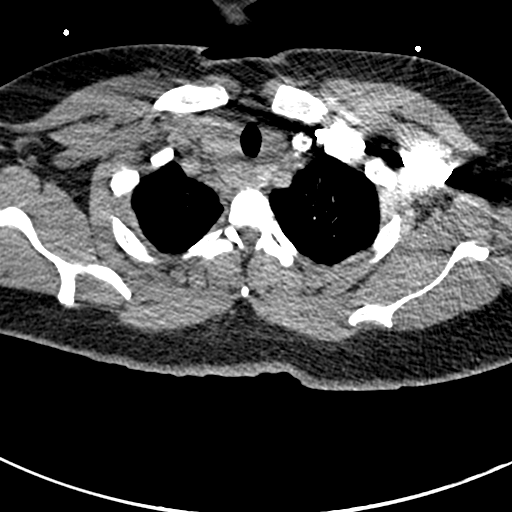
[im 252/267  lung]
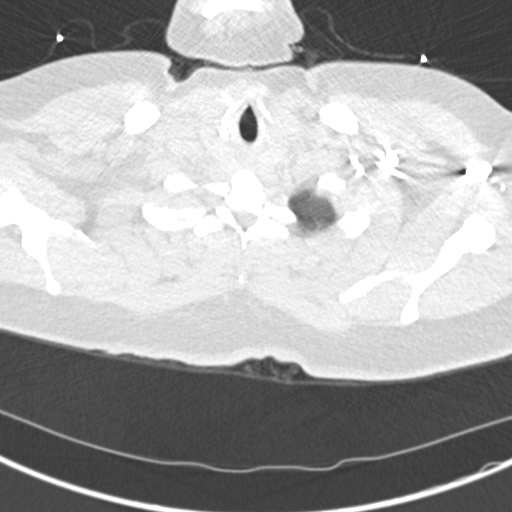

[Series 8: coronal mpr · coronal · 0.53mm/px · 1 of 130 slices shown]
[im 65/130  mediastinal]
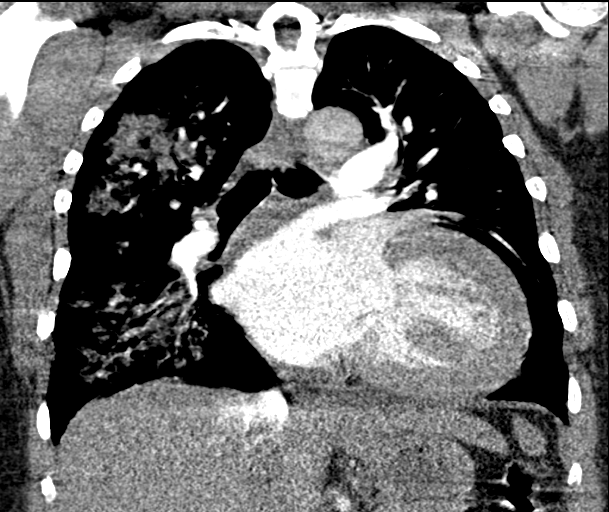

[18 of 36 positions shown; findings below may reference images not displayed]

RADIATION DOSE REDUCTION: This exam was performed according to the
departmental dose-optimization program which includes automated
exposure control, adjustment of the mA and/or kV according to
patient size and/or use of iterative reconstruction technique.

CONTRAST:  80mL OMNIPAQUE IOHEXOL 350 MG/ML SOLN
FINDINGS: Cardiovascular: Satisfactory opacification of the pulmonary arteries
to the segmental level. No evidence of pulmonary embolism. Enlarged
heart size. No pericardial effusion.

Mediastinum/Nodes: No worrisome lymph nodes, mass, or air leak

Lungs/Pleura: Interlobular septal thickening, small right pleural
effusion, and airspace opacity in the right more than left lungs.

Upper Abdomen: Hepatic venous reflux. The a patent cava is
prominently rounded/distended.

Musculoskeletal: No acute finding

Review of the MIP images confirms the above findings.
IMPRESSION: 1. CHF.
2. Asymmetric right-sided airspace disease favors alveolar edema
over pneumonia.
3. Negative for pulmonary embolism.

## 2022-06-30 IMAGING — CR DG CHEST 2V
2 series · 2 of 2 positions shown · non-contrast
Comparison: None.

CLINICAL DATA: Shortness of breath, back pain.

EXAM:
CHEST - 2 VIEW

[w chest pa]
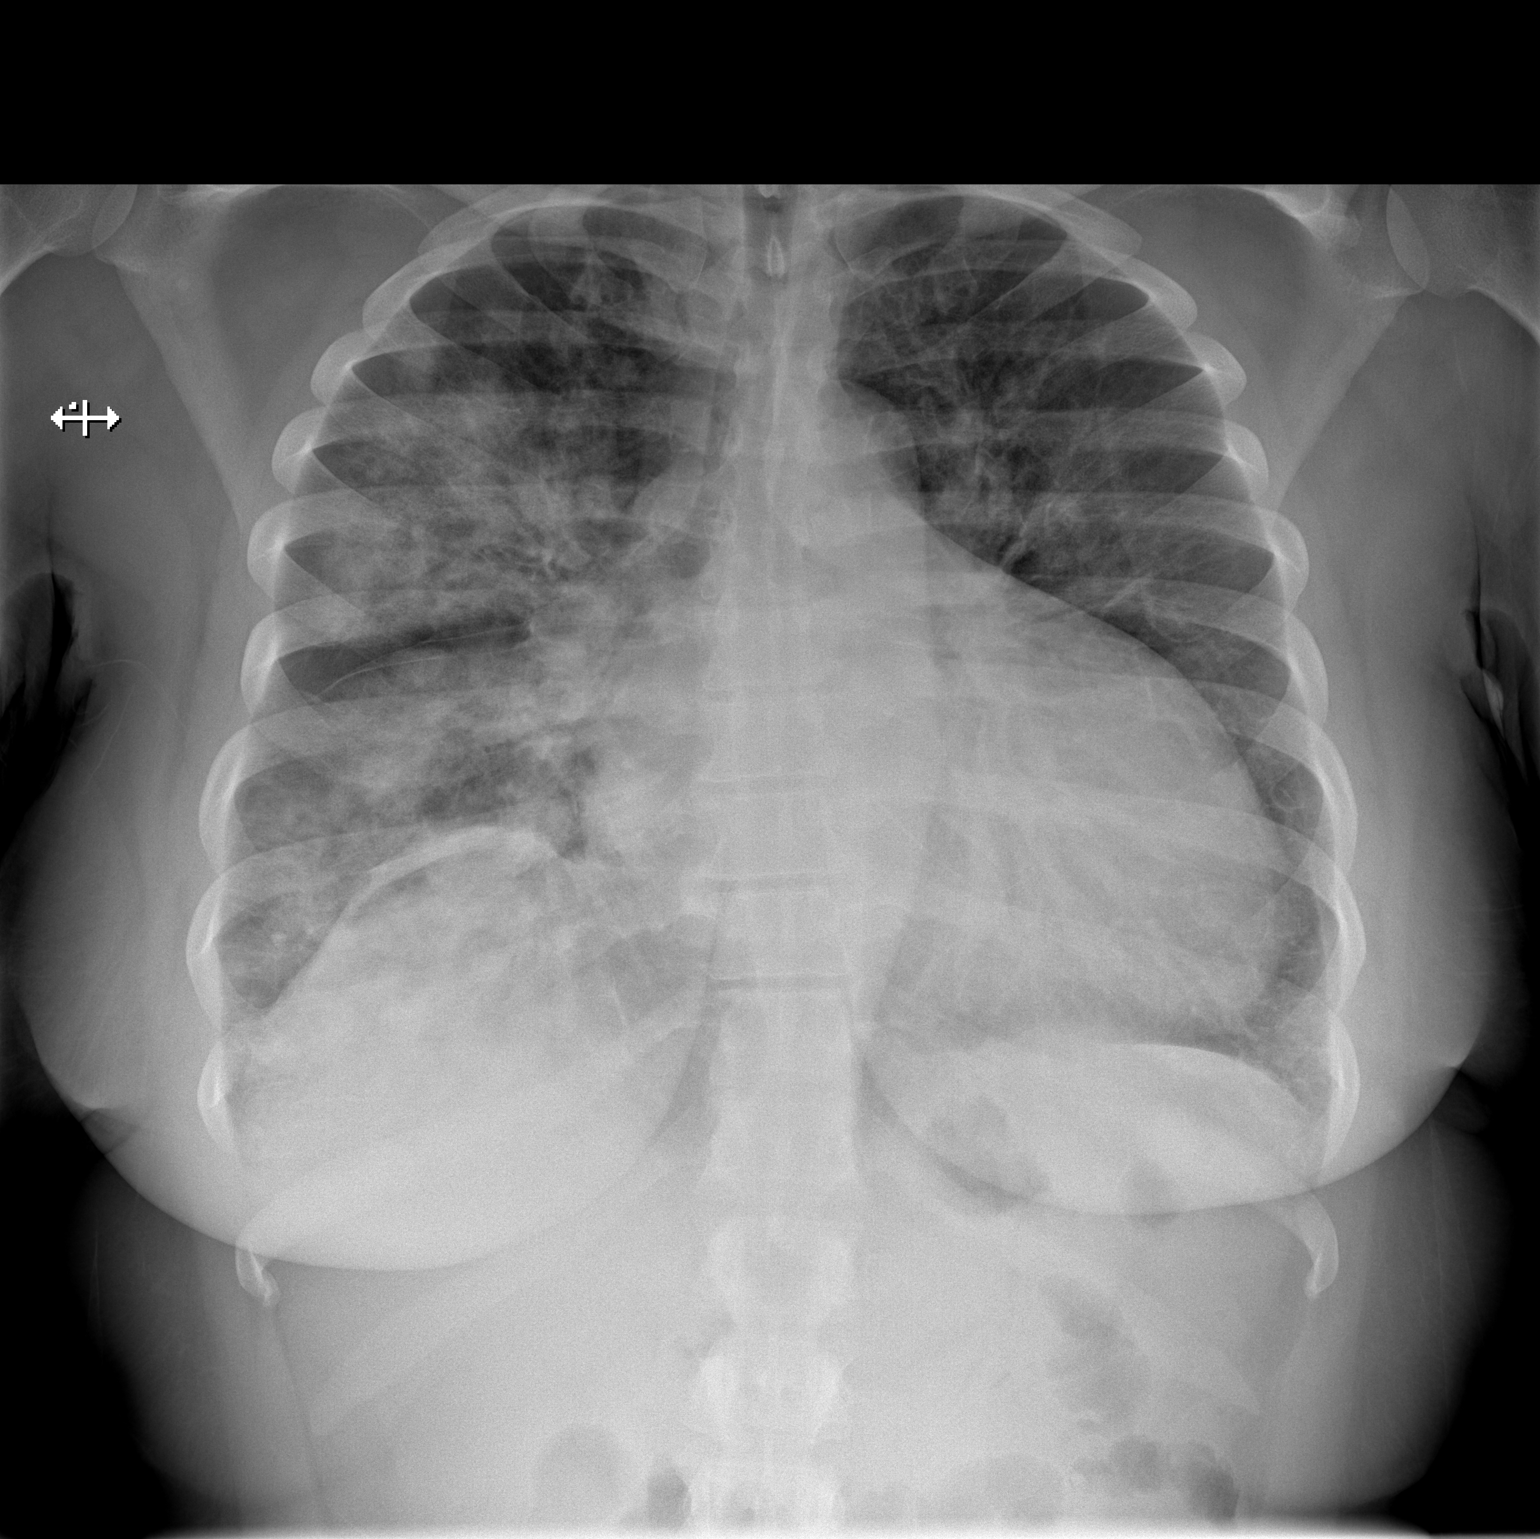

[w chest lat]
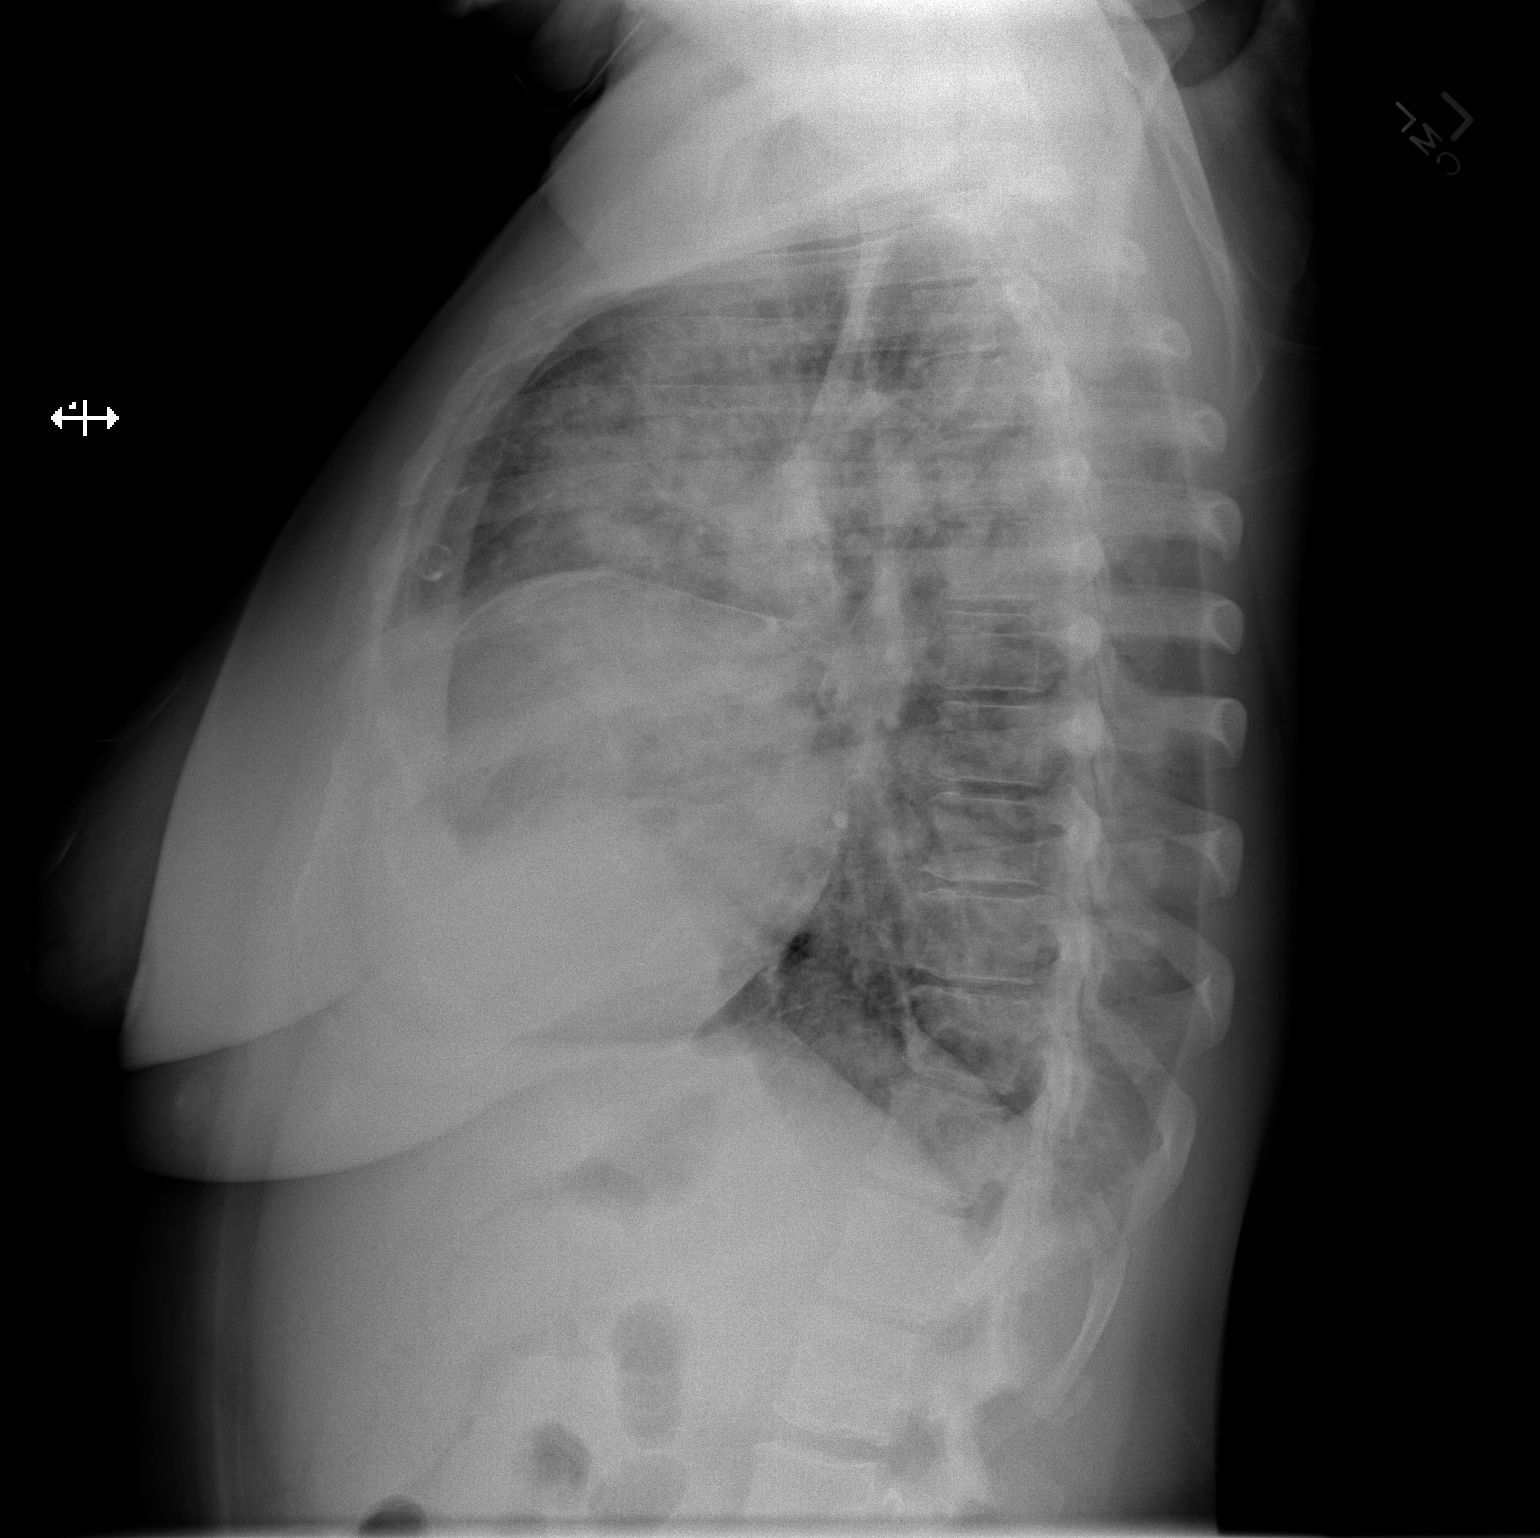

[2 of 2 positions shown; findings below may reference images not displayed]

FINDINGS: The heart is moderately enlarged. The mediastinal configuration is
within normal limits.

The central vessels are not well seen due to airspace disease but
are probably at least mildly distended. Small perfusion effusions
beginning to develop and there are subpleural septal Kerley B lines
in the bases compatible with edema

There are bilateral patchy perihilar alveolar opacities in the
right-greater-than-left upper, mid and lower lung fields sparing the
periphery, findings most likely due to airspace edema. Underlying
pneumonia is possible in the proper clinical setting.

Thoracic cage is intact.  There is slight thoracic dextroscoliosis.
IMPRESSION: 1. Enlarged heart silhouette, overall findings favoring CHF or fluid
overload with small pleural effusions forming.
2. Right greater than left perihilar alveolar opacities sparing the
periphery, most if not all of which most likely represent alveolar
edema. Underlying pneumonia would be difficult to exclude.
3. Clinical correlation and radiographic follow-up recommended. No
other evidence of acute chest disease.

## 2022-07-16 NOTE — Progress Notes (Deleted)
Cardiology Office Note:    Date:  07/16/2022   ID:  Robin Dudley, DOB 1986-09-14, MRN [id number]  PCP:  Patient, No Pcp Per  Cardiologist:  Chrystie Nose, MD  Electrophysiologist:  None   Referring MD: No ref. provider found   Chief Complaint: hospital follow-up of CHF  History of Present Illness:    Robin Dudley is a 36 y.o. female with a history of chronic systolic CHF with EF of 30-35%, hypertension, obesity, tobacco abuse, and medication non-compliance who is followed by Dr. Rennis Golden and presents today for hospital follow-up of CHF.  Patient was admitted in 12/2021 with acute CHF which was a new diagnosis. Echo showed LVEF of 30-35% with global hypokinesis and severe concentric LVH, normal RV, severe left atrial enlargement, mild to moderate AI, and moderate to severe MR (felt to likely be functional due to annular dilation in setting of severe left atrial enlargement). She was diuresed and discharged on Lasix 20mg  daily and Lisinopril 10mg  daily. Of note, Cardiology did not see her during this admission.  She was recently admitted from 06/17/2022 to 06/20/2022 again for acute on chronic CHF after she presented with several weeks of progressive dyspnea on exertion and lower extremity edema. Echo showed LVEF of 30-35% with global hypokinesis and severe LVH, mildly enlarged RV with mildly reduced systolic function, severe left atrial enlargement, mild to moderate AI, moderate MR/mild MS, and mild to moderate TR. She was diuresed and then transitioned to Lasix 40mg  daily. GDMT was adjusted and she was discharged on Losartan, Coreg, Spironolactone, Hydralazine, and Imdur. She was felt to be a good Entresto candidate but due to lack of insurance this was not prescribed. 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 Plan was to repeat Echo in 3 month after optimization of GDMT and if EF still down, would consider  ischemic evaluation.  Patient was seen in the [facility] on 06/28/2022 for follow-up at which time she was feeling well overall with only mild shortness of breath with exertion. ReDs Clip 50%. Lasix was increased to 60mg  daily and patient was switched to [facility] 49-51mg  twice daily. She was not started on SGLT2 inhibitor due to UTIs.   Patient presents today for follow-up. ***  Chronic Systolic CHF Patient recently admitted with CHF. Echo showed LVEF of 30-35% with global hypokinesis and severe LVH, mildly enlarged RV with mildly reduced systolic function, severe left atrial enlargement, mild to moderate AI, moderate MR/mild MS, and mild to moderate TR. - *** - Continue Lasix 60mg  daily. - Continue Entresto 49-51mg  twice daily. - Continue Coreg 12.5mg  twice daily. - Continue Spironolactone 25mg  daily. - Continue Hydralazine 25mg  three times daily and Imdur 30mg  daily. - No SGLT2 inhibitor due to UTIs. *** - Continue daily weights and sodium/fluid restrictions. - Will repeat BMET today. 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 Plan is for repeat Echo in 3 months after optimization of GDMT. This has already been ordered. If EF is still low at that, will need to consider ischemic cardiomypoathy at that time. Cardiac MRI is unable to be performed at this time due to lack of insurance.  - She was counseled at recent visit in [facility] about the fetotoxic risks of her cardiac medications were she to become pregnant.  ***  Hypertension BP *** - Continue medications for CHF as above.  Tobacco Abuse ***  Past  Medical History:  Diagnosis Date   Hypertension 12/24/2021    No past surgical history on file.  Current Medications: No outpatient medications have been marked as taking for the 07/21/22 encounter (Appointment) with Corrin Parker, PA-C.     Allergies:   Patient has no known allergies.   Social History    Socioeconomic History   Marital status: Single    Spouse name: Not on file   Number of children: Not on file   Years of education: Not on file   Highest education level: Not on file  Occupational History   Not on file  Tobacco Use   Smoking status: Every Day    Packs/day: 0.50    Types: Cigarettes   Smokeless tobacco: Not on file  Vaping Use   Vaping Use: Never used  Substance and Sexual Activity   Alcohol use: Yes    Comment: Social drinker now. Used to drink   Drug use: Yes    Frequency: 7.0 times per week    Types: Marijuana   Sexual activity: Yes  Other Topics Concern   Not on file  Social History Narrative   Not on file   Social Determinants of Health   Financial Resource Strain: Low Risk  (06/28/2022)   Overall Financial Resource Strain (CARDIA)    Difficulty of Paying Living Expenses: Not hard at all  Food Insecurity: No Food Insecurity (06/28/2022)   Hunger Vital Sign    Worried About Running Out of Food in the Last Year: Never true    Ran Out of Food in the Last Year: Never true  Transportation Needs: No Transportation Needs (06/28/2022)   PRAPARE - Administrator, Civil Service (Medical): No    Lack of Transportation (Non-Medical): No  Physical Activity: Sufficiently Active (06/28/2022)   Exercise Vital Sign    Days of Exercise per Week: 7 days    Minutes of Exercise per Session: 60 min  Stress: No Stress Concern Present (06/28/2022)   [employer] of Occupational Health - Occupational Stress Questionnaire    Feeling of Stress : Not at all  Social Connections: Socially Isolated (06/28/2022)   Social Connection and Isolation Panel [NHANES]    Frequency of Communication with Friends and Family: Never    Frequency of Social Gatherings with Friends and Family: Never    Attends Religious Services: Never    Database administrator or Organizations: No    Attends Engineer, structural: Never    Marital Status: Living with partner      Family History: The patient's family history includes Diabetes in her mother; Heart failure in her maternal aunt and maternal grandmother; Hyperlipidemia in her mother.  ROS:   Please see the history of present illness.     EKGs/Labs/Other Studies Reviewed:    The following studies were reviewed:  Echocardiogram 06/17/2022: Impressions:  1. Left ventricular ejection fraction, by estimation, is 30 to 35%. The  left ventricle has moderately decreased function. The left ventricle  demonstrates global hypokinesis. The left ventricular internal cavity size  was mildly dilated. There is severe  left ventricular hypertrophy. Indeterminate diastolic filling due to E-A  fusion.   2. Right ventricular systolic function is mildly reduced. The right  ventricular size is mildly enlarged. There is moderately elevated  pulmonary artery systolic pressure. The estimated right ventricular  systolic pressure is 54.0 mmHg.   3. Left atrial size was severely dilated.   4. Right atrial size was mildly dilated.  5. The mitral valve is abnormal. Moderate mitral valve regurgitation.  Mild mitral stenosis. The mean mitral valve gradient is 5.0 mmHg with  average heart rate of 102 bpm. MVA 1.7cm^2 by continuity equation.   6. Tricuspid valve regurgitation is mild to moderate.   7. The aortic valve is tricuspid. Aortic valve regurgitation is mild to  moderate. No aortic stenosis is present.   8. The inferior vena cava is dilated in size with >50% respiratory  variability, suggesting right atrial pressure of 8 mmHg.     EKG:  EKG not ordered today.   Recent Labs: 06/17/2022: B Natriuretic Peptide 2,710.3 06/18/2022: ALT 32 06/19/2022: Magnesium 1.8 06/20/2022: Hemoglobin 9.2; Platelets 299 06/28/2022: BUN 28; Creatinine, Ser 1.51; Potassium 4.5; Sodium 139  Recent Lipid Panel No results found for: "CHOL", "TRIG", "HDL", "CHOLHDL", "VLDL", "LDLCALC", "LDLDIRECT"  Physical Exam:    Vital Signs: There were  no vitals taken for this visit.    Wt Readings from Last 3 Encounters:  06/28/22 221 lb (100.2 kg)  06/20/22 221 lb 12.5 oz (100.6 kg)  12/24/21 200 lb (90.7 kg)     General: 36 y.o. female in no acute distress. HEENT: Normocephalic and atraumatic. Sclera clear. EOMs intact. Neck: Supple. No carotid bruits. No JVD. Heart: *** RRR. Distinct S1 and S2. No murmurs, gallops, or rubs. Radial and distal pedal pulses 2+ and equal bilaterally. Lungs: No increased work of breathing. Clear to ausculation bilaterally. No wheezes, rhonchi, or rales.  Abdomen: Soft, non-distended, and non-tender to palpation. Bowel sounds present in all 4 quadrants.  MSK: Normal strength and tone for age. *** Extremities: No lower extremity edema.    Skin: Warm and dry. Neuro: Alert and oriented x3. No focal deficits. Psych: Normal affect. Responds appropriately.   Assessment:    No diagnosis found.  Plan:     Disposition: Follow up in ***   Medication Adjustments/Labs and Tests Ordered: Current medicines are reviewed at length with the patient today.  Concerns regarding medicines are outlined above.  No orders of the defined types were placed in this encounter.  No orders of the defined types were placed in this encounter.   There are no Patient Instructions on file for this visit.   Signed, Corrin Parker, PA-C  07/16/2022 9:35 AM    [facility]

## 2022-07-18 ENCOUNTER — Inpatient Hospital Stay (HOSPITAL_COMMUNITY): Admission: RE | Admit: 2022-07-18 | Payer: Self-pay | Source: Ambulatory Visit

## 2022-07-19 ENCOUNTER — Other Ambulatory Visit (HOSPITAL_COMMUNITY): Payer: Self-pay

## 2022-07-21 ENCOUNTER — Ambulatory Visit: Payer: Self-pay | Admitting: Student

## 2022-07-22 NOTE — Telephone Encounter (Signed)
Advanced Heart Failure Patient Advocate Encounter  Called [employer] to check the status of the patient's application. Representative stated that POI is still needed. They mailed the patient a letter and left a message.  I attempted to call patient, no way to leave message.   Will be here to assist in the future, as needed.  Archer Asa, CPhT

## 2022-07-26 ENCOUNTER — Encounter: Payer: Self-pay | Admitting: Physician Assistant

## 2022-07-26 ENCOUNTER — Ambulatory Visit (INDEPENDENT_AMBULATORY_CARE_PROVIDER_SITE_OTHER): Payer: Self-pay | Admitting: Physician Assistant

## 2022-07-26 VITALS — BP 132/86 | HR 67 | Ht 65.0 in | Wt 223.6 lb

## 2022-07-26 DIAGNOSIS — I5022 Chronic systolic (congestive) heart failure: Secondary | ICD-10-CM

## 2022-07-26 DIAGNOSIS — I10 Essential (primary) hypertension: Secondary | ICD-10-CM

## 2022-07-26 MED ORDER — FUROSEMIDE 40 MG PO TABS: 40.0000 mg | ORAL_TABLET | Freq: Every day | ORAL | 2 refills | Status: DC

## 2022-07-26 MED ORDER — SACUBITRIL-VALSARTAN 97-103 MG PO TABS: 1.0000 | ORAL_TABLET | Freq: Two times a day (BID) | ORAL | 2 refills | Status: DC

## 2022-07-26 NOTE — Progress Notes (Signed)
Cardiology Office Note:    Date:  07/29/2022   ID:  Robin Dudley, DOB [birth year], MRN [id number]  PCP:  Patient, No Pcp Per   [facility] Providers Cardiologist:  Chrystie Nose, MD     Referring MD: No ref. provider found   No chief complaint on file.   History of Present Illness:    Robin Dudley is a 36 y.o. female with a hx of hypertension, chronic systolic heart failure, tobacco use, and obesity.  She has known history of heart failure.  She was previously hospitalized in January 2023 with acute systolic heart failure.  She was discharged on lisinopril and Lasix.  Echocardiogram obtained on 12/24/2021 showed EF 30 to 35%, global hypokinesis, severe LVH, normal RV, severe LAE, moderate to severe MR.  MR was felt to be functional due to annular dilatation in the setting of left atrial enlargement.  There was small pericardial effusion, mild to moderate AI. She does not follow-up with cardiology service consistently.  She recently was admitted in July 2023 with dyspnea on exertion after running out of her blood pressure medication.  On arrival, she was hypokalemic with potassium of 3.3, creatinine 1.8, BNP 2710.  Hemoglobin 8.7.  hCG negative.  CTA negative for PE however showed pulmonary edema.  Patient was diuresed with IV Lasix.  Echocardiogram obtained on 06/17/2022 showed EF 30 to 35%, global hypokinesis, severe LVH, RVSP 54 mmHg, severe LAE, moderate MR, mild to moderate TR.  Patient was ultimately discharged on 40 mg p.o. Lasix daily with 20 mEq daily of potassium supplement.  She was discharged on carvedilol, losartan, hydralazine, Imdur and spironolactone.  Since discharge, patient has been seen by heart failure service on 06/28/2022, losartan was stopped and the patient was switched to Entresto 49-51 mg twice a day.  Patient was not started SGLT2 we have better due to UTI.  Patient presents today for follow-up.  She denies any recent chest pain or worsening dyspnea.  She has  been compliant with her medication.  Last blood work obtained on 06/28/2022 shows creatinine trended up to 1.51.  She is tolerating the Entresto since the last visit.  I will increase Entresto to 97-103 mg twice a day.  I will decrease the Lasix from 60 mg daily down to 40 mg daily today given the higher dose of Entresto.  She will continue to monitor her daily weight.  She will need a basic metabolic panel today and also I repeat BMET when she see heart failure service in 10 days.  Otherwise, I will defer to heart failure service to order a repeat echocardiogram.  She can follow-up with Dr. Rennis Golden in 76-month.  Past Medical History:  Diagnosis Date   Hypertension 12/24/2021    No past surgical history on file.  Current Medications: Current Meds  Medication Sig   carvedilol (COREG) 12.5 MG tablet Take 1 tablet by mouth 2 (two) times daily with a meal.   hydrALAZINE (APRESOLINE) 25 MG tablet Take 1 tablet (25 mg total) by mouth 2 (two) times daily.   isosorbide mononitrate (IMDUR) 30 MG 24 hr tablet Take 1 tablet (30 mg total) by mouth daily.   Multiple Vitamin (MULTIVITAMIN PO) Take 1 tablet by mouth in the morning, at noon, and at bedtime.   sacubitril-valsartan (ENTRESTO) 97-103 MG Take 1 tablet by mouth 2 (two) times daily.   spironolactone (ALDACTONE) 25 MG tablet Take 1 tablet (25 mg total) by mouth daily.   [DISCONTINUED] furosemide (LASIX) 40 MG  tablet Take 1.5 tablets (60 mg total) by mouth daily.   [DISCONTINUED] sacubitril-valsartan (ENTRESTO) 49-51 MG Take 1 tablet by mouth 2 (two) times daily.     Allergies:   Patient has no known allergies.   Social History   Socioeconomic History   Marital status: Single    Spouse name: Not on file   Number of children: Not on file   Years of education: Not on file   Highest education level: Not on file  Occupational History   Not on file  Tobacco Use   Smoking status: Every Day    Packs/day: 0.50    Types: Cigarettes   Smokeless  tobacco: Not on file  Vaping Use   Vaping Use: Never used  Substance and Sexual Activity   Alcohol use: Yes    Comment: Social drinker now. Used to drink   Drug use: Yes    Frequency: 7.0 times per week    Types: Marijuana   Sexual activity: Yes  Other Topics Concern   Not on file  Social History Narrative   Not on file   Social Determinants of Health   Financial Resource Strain: Low Risk  (06/28/2022)   Overall Financial Resource Strain (CARDIA)    Difficulty of Paying Living Expenses: Not hard at all  Food Insecurity: No Food Insecurity (06/28/2022)   Hunger Vital Sign    Worried About Running Out of Food in the Last Year: Never true    Ran Out of Food in the Last Year: Never true  Transportation Needs: No Transportation Needs (06/28/2022)   PRAPARE - Hydrologist (Medical): No    Lack of Transportation (Non-Medical): No  Physical Activity: Sufficiently Active (06/28/2022)   Exercise Vital Sign    Days of Exercise per Week: 7 days    Minutes of Exercise per Session: 60 min  Stress: No Stress Concern Present (06/28/2022)   [place]    Feeling of Stress : Not at all  Social Connections: Socially Isolated (06/28/2022)   Social Connection and Isolation Panel [NHANES]    Frequency of Communication with Friends and Family: Never    Frequency of Social Gatherings with Friends and Family: Never    Attends Religious Services: Never    Marine scientist or Organizations: No    Attends Music therapist: Never    Marital Status: Living with partner     Family History: The patient's family history includes Diabetes in her mother; Heart failure in her maternal aunt and maternal grandmother; Hyperlipidemia in her mother.  ROS:   Please see the history of present illness.     All other systems reviewed and are negative.  EKGs/Labs/Other Studies Reviewed:    The following  studies were reviewed today:  Echo 06/17/2022  1. Left ventricular ejection fraction, by estimation, is 30 to 35%. The  left ventricle has moderately decreased function. The left ventricle  demonstrates global hypokinesis. The left ventricular internal cavity size  was mildly dilated. There is severe  left ventricular hypertrophy. Indeterminate diastolic filling due to E-A  fusion.   2. Right ventricular systolic function is mildly reduced. The right  ventricular size is mildly enlarged. There is moderately elevated  pulmonary artery systolic pressure. The estimated right ventricular  systolic pressure is 0000000 mmHg.   3. Left atrial size was severely dilated.   4. Right atrial size was mildly dilated.   5. The mitral valve  is abnormal. Moderate mitral valve regurgitation.  Mild mitral stenosis. The mean mitral valve gradient is 5.0 mmHg with  average heart rate of 102 bpm. MVA 1.7cm^2 by continuity equation.   6. Tricuspid valve regurgitation is mild to moderate.   7. The aortic valve is tricuspid. Aortic valve regurgitation is mild to  moderate. No aortic stenosis is present.   8. The inferior vena cava is dilated in size with >50% respiratory  variability, suggesting right atrial pressure of 8 mmHg.   EKG:  EKG is not ordered today.    Recent Labs: 06/17/2022: B Natriuretic Peptide 2,710.3 06/18/2022: ALT 32 06/19/2022: Magnesium 1.8 06/20/2022: Hemoglobin 9.2; Platelets 299 07/26/2022: BUN 22; Creatinine, Ser 1.37; Potassium 4.4; Sodium 144  Recent Lipid Panel No results found for: "CHOL", "TRIG", "HDL", "CHOLHDL", "VLDL", "LDLCALC", "LDLDIRECT"   Risk Assessment/Calculations:           Physical Exam:    VS:  BP 132/86 (BP Location: Left Arm, Patient Position: Sitting, Cuff Size: Large)   Pulse 67   Ht 5\' 5"  (1.651 m)   Wt 223 lb 9.6 oz (101.4 kg)   SpO2 98%   BMI 37.21 kg/m        Wt Readings from Last 3 Encounters:  07/26/22 223 lb 9.6 oz (101.4 kg)  06/28/22 221 lb  (100.2 kg)  06/20/22 221 lb 12.5 oz (100.6 kg)     GEN:  Well nourished, well developed in no acute distress HEENT: Normal NECK: No JVD; No carotid bruits LYMPHATICS: No lymphadenopathy CARDIAC: RRR, no murmurs, rubs, gallops RESPIRATORY:  Clear to auscultation without rales, wheezing or rhonchi  ABDOMEN: Soft, non-tender, non-distended MUSCULOSKELETAL:  No edema; No deformity  SKIN: Warm and dry NEUROLOGIC:  Alert and oriented x 3 PSYCHIATRIC:  Normal affect   ASSESSMENT:    1. Chronic systolic heart failure (HCC)   2. Essential hypertension    PLAN:    In order of problems listed above:  Chronic diastolic heart failure: Increase Entresto to 97-103 mg twice a day.  Decrease Lasix to 40 mg daily given the higher dose of the Entresto.  Obtain basic metabolic panel today.  Patient is scheduled to see heart failure service in 10 days, she can have a repeat basic metabolic panel at that time.  We will defer to heart failure service to obtain repeat echocardiogram once heart failure medication has been fully titrated.  Hypertension: Blood pressure in the 130s, will increase Entresto further.           Medication Adjustments/Labs and Tests Ordered: Current medicines are reviewed at length with the patient today.  Concerns regarding medicines are outlined above.  Orders Placed This Encounter  Procedures   Basic Metabolic Panel (BMET)   Meds ordered this encounter  Medications   furosemide (LASIX) 40 MG tablet    Sig: Take 1 tablet (40 mg total) by mouth daily.    Dispense:  90 tablet    Refill:  2    Please cancel all previous orders for current medication. Change in dosage or pill size.   sacubitril-valsartan (ENTRESTO) 97-103 MG    Sig: Take 1 tablet by mouth 2 (two) times daily.    Dispense:  180 tablet    Refill:  2    Patient Instructions  Medication Instructions:  Your physician has recommended you make the following change in your medication:  DECREASE: Lasix  40mg  daily  INCREASE: Entresto 97-103mg  TWICE Daily *If you need a refill on your cardiac medications  before your next appointment, please call your pharmacy*   Lab Work: BMP If you have labs (blood work) drawn today and your tests are completely normal, you will receive your results only by: [place] (if you have MyChart) OR A paper copy in the mail If you have any lab test that is abnormal or we need to change your treatment, we will call you to review the results.   Testing/Procedures: NONE   Follow-Up: At [facility], you and your health needs are our priority.  As part of our continuing mission to provide you with exceptional heart care, we have created designated Provider Care Teams.  These Care Teams include your primary Cardiologist (physician) and Advanced Practice Providers (APPs -  Physician Assistants and Nurse Practitioners) who all work together to provide you with the care you need, when you need it.  We recommend signing up for the patient portal called "MyChart".  Sign up information is provided on this After Visit Summary.  MyChart is used to connect with patients for Virtual Visits (Telemedicine).  Patients are able to view lab/test results, encounter notes, upcoming appointments, etc.  Non-urgent messages can be sent to your provider as well.   To learn more about what you can do with MyChart, go to [email or web address].    Your next appointment:   5 month(s)  The format for your next appointment:   In Person  Provider:   Pixie Casino, MD    Signed, Almyra Deforest, [place]  07/29/2022 12:08 AM    [place]

## 2022-07-26 NOTE — Patient Instructions (Addendum)
Medication Instructions:  Your physician has recommended you make the following change in your medication:  DECREASE: Lasix 40mg  daily  INCREASE: Entresto 97-103mg  TWICE Daily *If you need a refill on your cardiac medications before your next appointment, please call your pharmacy*   Lab Work: BMP If you have labs (blood work) drawn today and your tests are completely normal, you will receive your results only by: MyChart Message (if you have MyChart) OR A paper copy in the mail If you have any lab test that is abnormal or we need to change your treatment, we will call you to review the results.   Testing/Procedures: NONE   Follow-Up: At [facility], you and your health needs are our priority.  As part of our continuing mission to provide you with exceptional heart care, we have created designated Provider Care Teams.  These Care Teams include your primary Cardiologist (physician) and Advanced Practice Providers (APPs -  Physician Assistants and Nurse Practitioners) who all work together to provide you with the care you need, when you need it.  We recommend signing up for the patient portal called "MyChart".  Sign up information is provided on this After Visit Summary.  MyChart is used to connect with patients for Virtual Visits (Telemedicine).  Patients are able to view lab/test results, encounter notes, upcoming appointments, etc.  Non-urgent messages can be sent to your provider as well.   To learn more about what you can do with MyChart, go to [facility].    Your next appointment:   5 month(s)  The format for your next appointment:   In Person  Provider:   [email or web address], MD

## 2022-07-27 LAB — BASIC METABOLIC PANEL
BUN/Creatinine Ratio: 16 (ref 9–23)
BUN: 22 mg/dL — ABNORMAL HIGH (ref 6–20)
CO2: 22 mmol/L (ref 20–29)
Calcium: 9.2 mg/dL (ref 8.7–10.2)
Chloride: 106 mmol/L (ref 96–106)
Creatinine, Ser: 1.37 mg/dL — ABNORMAL HIGH (ref 0.57–1.00)
Glucose: 90 mg/dL (ref 70–99)
Potassium: 4.4 mmol/L (ref 3.5–5.2)
Sodium: 144 mmol/L (ref 134–144)
eGFR: 51 mL/min/{1.73_m2} — ABNORMAL LOW (ref >59)

## 2022-07-29 ENCOUNTER — Other Ambulatory Visit (HOSPITAL_COMMUNITY): Payer: Self-pay

## 2022-07-29 NOTE — Progress Notes (Incomplete)
Cardiology Office Note:    Date:  07/26/2022   ID:  Robin Dudley, DOB [birth year], MRN [id number]  PCP:  Patient, No Pcp Per   [facility] Providers Cardiologist:  Chrystie Nose, MD     Referring MD: No ref. provider found   No chief complaint on file.   History of Present Illness:    Robin Dudley is a 36 y.o. female with a hx of hypertension, chronic systolic heart failure, tobacco use, and obesity.  She has known history of heart failure.  She was previously hospitalized in January 2023 with acute systolic heart failure.  She was discharged on lisinopril, Lasix.  Echocardiogram obtained on 12/24/2021 showed EF 30 to 35%, global hypokinesis, severe LVH, normal RV, severe LAE, moderate to severe MR.  MR was felt to be functional due to annular dilatation in the setting of left atrial enlargement.  There was small pericardial effusion, mild to moderate AI.  On 420 she does not follow-up with cardiology service consistently.  She recently was admitted in July 2023 with dyspnea on exertion after running out of her blood pressure medication.  On arrival, she was hypokalemic with potassium of 3.3, creatinine 1.8, BNP 2710.  Hemoglobin 8.7.  hCG negative.  CTA negative for PE however showed pulmonary edema.  Patient was diuresed with IV Lasix.  Echocardiogram obtained on 06/17/2022 showed EF 30 to 35%, global hypokinesis, severe LVH, RVSP 54 mmHg, severe LAE, moderate MR, mild to moderate TR.  Patient was ultimately discharged on 40 mg p.o. Lasix daily with 20 mEq daily of potassium supplement.  She was discharged on carvedilol, losartan, hydralazine, Imdur and spironolactone.  Since discharge, patient has been seen by heart failure service on 06/28/2022, losartan was stopped and the patient was switched to Entresto 49-51 mg twice a day.  Patient was not started SGLT2 we have better due to UTI.  Patient presents today for follow-up.  She denies any recent chest pain or worsening dyspnea.  She  has been compliant with her medication.  Last blood work obtained on 06/28/2022 shows creatinine trended up to 1.51.  She is tolerating the Entresto since the last visit.  I will increase Entresto to 97-103 mg twice a day.  I will decrease the Lasix from 60 mg daily down to 40 mg daily today given the higher dose of Entresto.  She will continue to monitor her daily weight.  She will need a basic metabolic panel today and also I repeat BMET when she see heart failure service in 10 days.  Otherwise, I will defer to heart failure service to order a repeat echocardiogram.  She can follow-up with Dr. Rennis Golden in 63-month.  Past Medical History:  Diagnosis Date  . Hypertension 12/24/2021    No past surgical history on file.  Current Medications: Current Meds  Medication Sig  . carvedilol (COREG) 12.5 MG tablet Take 1 tablet by mouth 2 (two) times daily with a meal.  . furosemide (LASIX) 40 MG tablet Take 1.5 tablets (60 mg total) by mouth daily.  . hydrALAZINE (APRESOLINE) 25 MG tablet Take 1 tablet (25 mg total) by mouth 2 (two) times daily.  . isosorbide mononitrate (IMDUR) 30 MG 24 hr tablet Take 1 tablet (30 mg total) by mouth daily.  . Multiple Vitamin (MULTIVITAMIN PO) Take 1 tablet by mouth in the morning, at noon, and at bedtime.  . sacubitril-valsartan (ENTRESTO) 49-51 MG Take 1 tablet by mouth 2 (two) times daily.  Marland Kitchen spironolactone (ALDACTONE) 25  MG tablet Take 1 tablet (25 mg total) by mouth daily.     Allergies:   Patient has no known allergies.   Social History   Socioeconomic History  . Marital status: Single    Spouse name: Not on file  . Number of children: Not on file  . Years of education: Not on file  . Highest education level: Not on file  Occupational History  . Not on file  Tobacco Use  . Smoking status: Every Day    Packs/day: 0.50    Types: Cigarettes  . Smokeless tobacco: Not on file  Vaping Use  . Vaping Use: Never used  Substance and Sexual Activity  . Alcohol  use: Yes    Comment: Social drinker now. Used to drink  . Drug use: Yes    Frequency: 7.0 times per week    Types: Marijuana  . Sexual activity: Yes  Other Topics Concern  . Not on file  Social History Narrative  . Not on file   Social Determinants of Health   Financial Resource Strain: Low Risk  (06/28/2022)   Overall Financial Resource Strain (CARDIA)   . Difficulty of Paying Living Expenses: Not hard at all  Food Insecurity: No Food Insecurity (06/28/2022)   Hunger Vital Sign   . Worried About Programme researcher, broadcasting/film/video in the Last Year: Never true   . Ran Out of Food in the Last Year: Never true  Transportation Needs: No Transportation Needs (06/28/2022)   PRAPARE - Transportation   . Lack of Transportation (Medical): No   . Lack of Transportation (Non-Medical): No  Physical Activity: Sufficiently Active (06/28/2022)   Exercise Vital Sign   . Days of Exercise per Week: 7 days   . Minutes of Exercise per Session: 60 min  Stress: No Stress Concern Present (06/28/2022)   [employer] of Occupational Health - Occupational Stress Questionnaire   . Feeling of Stress : Not at all  Social Connections: Socially Isolated (06/28/2022)   Social Connection and Isolation Panel [NHANES]   . Frequency of Communication with Friends and Family: Never   . Frequency of Social Gatherings with Friends and Family: Never   . Attends Religious Services: Never   . Active Member of Clubs or Organizations: No   . Attends Banker Meetings: Never   . Marital Status: Living with partner     Family History: The patient's family history includes Diabetes in her mother; Heart failure in her maternal aunt and maternal grandmother; Hyperlipidemia in her mother.  ROS:   Please see the history of present illness.    *** All other systems reviewed and are negative.  EKGs/Labs/Other Studies Reviewed:    The following studies were reviewed today: ***  EKG:  EKG is *** ordered today.  The ekg  ordered today demonstrates ***  Recent Labs: 06/17/2022: B Natriuretic Peptide 2,710.3 06/18/2022: ALT 32 06/19/2022: Magnesium 1.8 06/20/2022: Hemoglobin 9.2; Platelets 299 06/28/2022: BUN 28; Creatinine, Ser 1.51; Potassium 4.5; Sodium 139  Recent Lipid Panel No results found for: "CHOL", "TRIG", "HDL", "CHOLHDL", "VLDL", "LDLCALC", "LDLDIRECT"   Risk Assessment/Calculations:   {Does this patient have ATRIAL FIBRILLATION?:[phone]}       Physical Exam:    VS:  BP 132/86 (BP Location: Left Arm, Patient Position: Sitting, Cuff Size: Large)   Pulse 67   Ht 5\' 5"  (1.651 m)   Wt 223 lb 9.6 oz (101.4 kg)   SpO2 98%   BMI 37.21 kg/m  Wt Readings from Last 3 Encounters:  07/26/22 223 lb 9.6 oz (101.4 kg)  06/28/22 221 lb (100.2 kg)  06/20/22 221 lb 12.5 oz (100.6 kg)     GEN: *** Well nourished, well developed in no acute distress HEENT: Normal NECK: No JVD; No carotid bruits LYMPHATICS: No lymphadenopathy CARDIAC: ***RRR, no murmurs, rubs, gallops RESPIRATORY:  Clear to auscultation without rales, wheezing or rhonchi  ABDOMEN: Soft, non-tender, non-distended MUSCULOSKELETAL:  No edema; No deformity  SKIN: Warm and dry NEUROLOGIC:  Alert and oriented x 3 PSYCHIATRIC:  Normal affect   ASSESSMENT:    No diagnosis found. PLAN:    In order of problems listed above:  ***      {Are you ordering a CV Procedure (e.g. stress test, cath, DCCV, TEE, etc)?   Press F2        :[id number]}    Medication Adjustments/Labs and Tests Ordered: Current medicines are reviewed at length with the patient today.  Concerns regarding medicines are outlined above.  No orders of the defined types were placed in this encounter.  No orders of the defined types were placed in this encounter.   There are no Patient Instructions on file for this visit.   Ramond Dial, [place]  07/26/2022 2:56 PM    [facility]

## 2022-08-03 ENCOUNTER — Other Ambulatory Visit (HOSPITAL_COMMUNITY): Payer: Self-pay

## 2022-08-04 ENCOUNTER — Telehealth (HOSPITAL_COMMUNITY): Payer: Self-pay

## 2022-08-04 NOTE — Telephone Encounter (Signed)
Called to confirm/remind patient of their appointment at [facility] on 08/05/22.   Patient reminded to bring all medications and/or complete list.  Confirmed patient has transportation. Gave directions, instructed to utilize valet parking.  Confirmed appointment prior to ending call.

## 2022-08-05 ENCOUNTER — Ambulatory Visit (HOSPITAL_COMMUNITY)
Admission: RE | Admit: 2022-08-05 | Discharge: 2022-08-05 | Disposition: A | Discharge status: Home / Self Care | Payer: Self-pay | Source: Ambulatory Visit | Attending: Family Medicine | Admitting: Family Medicine

## 2022-08-05 ENCOUNTER — Other Ambulatory Visit (HOSPITAL_COMMUNITY): Payer: Self-pay

## 2022-08-05 ENCOUNTER — Encounter (HOSPITAL_COMMUNITY): Payer: Self-pay

## 2022-08-05 VITALS — BP 130/84 | HR 80 | Wt 227.8 lb

## 2022-08-05 DIAGNOSIS — D508 Other iron deficiency anemias: Secondary | ICD-10-CM

## 2022-08-05 DIAGNOSIS — Z139 Encounter for screening, unspecified: Secondary | ICD-10-CM

## 2022-08-05 DIAGNOSIS — I10 Essential (primary) hypertension: Secondary | ICD-10-CM

## 2022-08-05 DIAGNOSIS — Z720 Tobacco use: Secondary | ICD-10-CM

## 2022-08-05 DIAGNOSIS — I110 Hypertensive heart disease with heart failure: Secondary | ICD-10-CM | POA: Insufficient documentation

## 2022-08-05 DIAGNOSIS — I3139 Other pericardial effusion (noninflammatory): Secondary | ICD-10-CM | POA: Insufficient documentation

## 2022-08-05 DIAGNOSIS — Z79899 Other long term (current) drug therapy: Secondary | ICD-10-CM | POA: Insufficient documentation

## 2022-08-05 DIAGNOSIS — Z7984 Long term (current) use of oral hypoglycemic drugs: Secondary | ICD-10-CM | POA: Insufficient documentation

## 2022-08-05 DIAGNOSIS — I5022 Chronic systolic (congestive) heart failure: Secondary | ICD-10-CM

## 2022-08-05 DIAGNOSIS — D649 Anemia, unspecified: Secondary | ICD-10-CM | POA: Insufficient documentation

## 2022-08-05 DIAGNOSIS — R0602 Shortness of breath: Secondary | ICD-10-CM | POA: Insufficient documentation

## 2022-08-05 LAB — FERRITIN: Ferritin: 20 ng/mL (ref 11–307)

## 2022-08-05 LAB — CBC
HCT: 37.1 % (ref 36.0–46.0)
Hemoglobin: 12 g/dL (ref 12.0–15.0)
MCH: 25.2 pg — ABNORMAL LOW (ref 26.0–34.0)
MCHC: 32.3 g/dL (ref 30.0–36.0)
MCV: 77.9 fL — ABNORMAL LOW (ref 80.0–100.0)
Platelets: 222 10*3/uL (ref 150–400)
RBC: 4.76 MIL/uL (ref 3.87–5.11)
RDW: 21 % — ABNORMAL HIGH (ref 11.5–15.5)
WBC: 8.4 10*3/uL (ref 4.0–10.5)
nRBC: 0 % (ref 0.0–0.2)

## 2022-08-05 LAB — BASIC METABOLIC PANEL
Anion gap: 9 (ref 5–15)
BUN: 23 mg/dL — ABNORMAL HIGH (ref 6–20)
CO2: 24 mmol/L (ref 22–32)
Calcium: 9.2 mg/dL (ref 8.9–10.3)
Chloride: 106 mmol/L (ref 98–111)
Creatinine, Ser: 1.23 mg/dL — ABNORMAL HIGH (ref 0.44–1.00)
GFR, Estimated: 58 mL/min — ABNORMAL LOW (ref >60)
Glucose, Bld: 94 mg/dL (ref 70–99)
Potassium: 4.1 mmol/L (ref 3.5–5.1)
Sodium: 139 mmol/L (ref 135–145)

## 2022-08-05 LAB — IRON AND TIBC
Iron: 53 ug/dL (ref 28–170)
Saturation Ratios: 16 % (ref 10.4–31.8)
TIBC: 336 ug/dL (ref 250–450)
UIBC: 283 ug/dL

## 2022-08-05 MED ORDER — DAPAGLIFLOZIN PROPANEDIOL 10 MG PO TABS
10.0000 mg | ORAL_TABLET | Freq: Every day | ORAL | 6 refills | Status: DC
  Filled 2022-08-05 – 2023-01-23 (×2): qty 30, 30d supply, fill #0
  Filled 2023-02-16 – 2023-02-26 (×3): qty 30, 30d supply, fill #1

## 2022-08-05 NOTE — Patient Instructions (Addendum)
Thank you for coming in today  Labs were done today, if any labs are abnormal the clinic will call you No news is good news  Your physician recommends that you return for lab work in:  2 weeks for BMET  Your physician recommends that you schedule a follow-up appointment in:  2-3 months with Dr. Gala Romney with echocardiogram  Your physician has requested that you have an echocardiogram. Echocardiography is a painless test that uses sound waves to create images of your heart. It provides your doctor with information about the size and shape of your heart and how well your heart's chambers and valves are working. This procedure takes approximately one hour. There are no restrictions for this procedure.   START Farxiga 10 mg 1 tablet daily   You have been provided a a prescription for a blood pressure cuff    Do the following things EVERYDAY: Weigh yourself in the morning before breakfast. Write it down and keep it in a log. Take your medicines as prescribed Eat low salt foods--Limit salt (sodium) to 2000 mg per day.  Stay as active as you can everyday Limit all fluids for the day to less than 2 liters  At the [facility], you and your health needs are our priority. As part of our continuing mission to provide you with exceptional heart care, we have created designated Provider Care Teams. These Care Teams include your primary Cardiologist (physician) and Advanced Practice Providers (APPs- Physician Assistants and Nurse Practitioners) who all work together to provide you with the care you need, when you need it.   You may see any of the following providers on your designated Care Team at your next follow up: Dr Arvilla Meres Dr Marca Ancona Dr. Marcos Eke, NP Robbie Lis, [place] [facility], [place] Brynda Peon, NP Karle Plumber, PharmD   Please be sure to bring in all your medications bottles to every appointment.    If you  have any questions or concerns before your next appointment please send [place] a message through [place] or call our office at [phone].    TO LEAVE A MESSAGE FOR THE NURSE SELECT OPTION 2, PLEASE LEAVE A MESSAGE INCLUDING: YOUR NAME DATE OF BIRTH CALL BACK NUMBER REASON FOR CALL**this is important as we prioritize the call backs  YOU WILL RECEIVE A CALL BACK THE SAME DAY AS LONG AS YOU CALL BEFORE 4:00 PM

## 2022-08-05 NOTE — Progress Notes (Signed)
ReDS Vest / Clip - 08/05/22 1000       ReDS Vest / Clip   Station Marker B    Ruler Value 32    ReDS Value Range High volume overload    ReDS Actual Value 43

## 2022-08-05 NOTE — Progress Notes (Signed)
ADVANCED HF CLINIC CONSULT NOTE  PCP: Patient, No Pcp Per HF Cardiologist: Dr. Gala Romney   HPI: Robin Dudley is a 36 y.o.with h/o HTN, tobacco abuse and chronic HFrEF.    Admitted 12/24/21 for acute systolic heart failure.  She was discharged on Lasix 20 mg daily and lisinopril 10 mg daily. Echo from 12/24/2021 with LVEF 30 to 35%, global hypokinesis, severe concentric LVH, normal RV, severe LAE, moderate to severe mitral regurgitation with EROA 0.3cm2, RVol 71mL. Likely functional due to annular dilation in the setting of severe LA enlargement.  Small pericardial effusion.  Mild to moderate aortic regurgitation.   Admitted 06/17/22 with A/C HFrEF. Diuresed with IV lasix and transitioned to Lasix 40 mg daily. Discharged on hydralazine/imdur, losartan, carvedilol, and spiro.    Seen in [facility] 7/23, NYHA II-III, volume up. Entresto started and Lasix increased to 60 mg daily.  Today she returns for HF follow up, referred from [facility]. Overall feeling fine. Works out at [employer] 2x/week; TM and [employer]. She has mild SOB walking further distances on flat ground. Denies palpitations, abnormal bleeding, CP, dizziness, or edema. Chronically sleeps on 4 pillows. Appetite ok. No fever or chills. Weight at home 225-226 pounds. Taking all medications. Works full time, stopped smoking 1 ppd, sexually active w/ boyfriend, contracepting with condoms.  Drinks ETOH occasionally. 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 Drinks a lot of fluids during the day, > 2/fluid.   Cardiac Testing  - Echo (7/23): EF 30-35% RV mildly reduced.   - Echo (1/23): EF 30-35% RV normal   Review of Systems: [y] = yes, [ ]  = no    General: Weight gain [ ] ; Weight loss [ ] ; Anorexia [ ] ; Fatigue ]; Fever [ ] ; Chills [ ] ; Weakness [ ]   Cardiac: Chest pain/pressure [ ] ; Resting SOB [ ] ; Exertional SOB [ ] ; Orthopnea [ ] ; Pedal Edema [ ] ; Palpitations [ ] ; Syncope [ ] ; Presyncope [ ] ; Paroxysmal nocturnal  dyspnea[ ]   Pulmonary: Cough [ ] ; Wheezing[ ] ; Hemoptysis[ ] ; Sputum [ ] ; Snoring [ ]   GI: Vomiting[ ] ; Dysphagia[ ] ; Melena[ ] ; Hematochezia [ ] ; Heartburn[ ] ; Abdominal pain [ ] ; Constipation [ ] ; Diarrhea [ ] ; BRBPR [ ]   GU: Hematuria[ ] ; Dysuria [ ] ; Nocturia[ ]   Vascular: Pain in legs with walking [ ] ; Pain in feet with lying flat [ ] ; Non-healing sores [ ] ; Stroke [ ] ; TIA [ ] ; Slurred speech [ ] ;  Neuro: Headaches[ ] ; Vertigo[ ] ; Seizures[ ] ; Paresthesias[ ] ;Blurred vision [ ] ; Diplopia [ ] ; Vision changes [ ]   Ortho/Skin: Arthritis [ ] ; Joint pain [ ] ; Muscle pain ]; Joint swelling [ ] ; Back Pain [ ] ; Rash [ ]   Psych: Depression[ ] ; Anxiety[ ]   Heme: Bleeding problems [ ] ; Clotting disorders [ ] ; Anemia [ ]   Endocrine: Diabetes [ ] ; Thyroid dysfunction[ ]    Past Medical History:  Diagnosis Date   Hypertension 12/24/2021   Current Outpatient Medications  Medication Sig Dispense Refill   acetaminophen (TYLENOL) 650 MG CR tablet Take 1,300 mg by mouth daily as needed for pain (leg pain).     carvedilol (COREG) 12.5 MG tablet Take 1 tablet by mouth 2 (two) times daily with a meal. 60 tablet 1   furosemide (LASIX) 40 MG tablet Take 1 tablet (40 mg total) by mouth daily. 90 tablet 2   hydrALAZINE (APRESOLINE) 25 MG tablet Take 1 tablet (25 mg total) by mouth 2 (  two) times daily. 60 tablet 1   isosorbide mononitrate (IMDUR) 30 MG 24 hr tablet Take 1 tablet (30 mg total) by mouth daily. 30 tablet 1   Multiple Vitamin (MULTIVITAMIN PO) Take 1 tablet by mouth 2 (two) times daily.     sacubitril-valsartan (ENTRESTO) 97-103 MG Take 1 tablet by mouth 2 (two) times daily. 180 tablet 2   spironolactone (ALDACTONE) 25 MG tablet Take 1 tablet (25 mg total) by mouth daily. 30 tablet 1   No current facility-administered medications for this encounter.   No Known Allergies  Social History   Socioeconomic History   Marital status: Single    Spouse name: Not on file   Number of children: Not  on file   Years of education: Not on file   Highest education level: Not on file  Occupational History   Not on file  Tobacco Use   Smoking status: Every Day    Packs/day: 0.50    Types: Cigarettes   Smokeless tobacco: Not on file  Vaping Use   Vaping Use: Never used  Substance and Sexual Activity   Alcohol use: Yes    Comment: Social drinker now. Used to drink   Drug use: Yes    Frequency: 7.0 times per week    Types: Marijuana   Sexual activity: Yes  Other Topics Concern   Not on file  Social History Narrative   Not on file   Social Determinants of Health   Financial Resource Strain: Low Risk  (06/28/2022)   Overall Financial Resource Strain (CARDIA)    Difficulty of Paying Living Expenses: Not hard at all  Food Insecurity: No Food Insecurity (06/28/2022)   Hunger Vital Sign    Worried About Running Out of Food in the Last Year: Never true    Ran Out of Food in the Last Year: Never true  Transportation Needs: No Transportation Needs (06/28/2022)   PRAPARE - Administrator, Civil Service (Medical): No    Lack of Transportation (Non-Medical): No  Physical Activity: Sufficiently Active (06/28/2022)   Exercise Vital Sign    Days of Exercise per Week: 7 days    Minutes of Exercise per Session: 60 min  Stress: No Stress Concern Present (06/28/2022)   [employer] of Occupational Health - Occupational Stress Questionnaire    Feeling of Stress : Not at all  Social Connections: Socially Isolated (06/28/2022)   Social Connection and Isolation Panel [NHANES]    Frequency of Communication with Friends and Family: Never    Frequency of Social Gatherings with Friends and Family: Never    Attends Religious Services: Never    Database administrator or Organizations: No    Attends Engineer, structural: Never    Marital Status: Living with partner  Intimate Partner Violence: Not on file   Family History  Problem Relation Age of Onset   Diabetes Mother     Hyperlipidemia Mother    Heart failure Maternal Aunt    Heart failure Maternal Grandmother    BP 130/84   Pulse 80   Wt 103.3 kg (227 lb 12.8 oz)   SpO2 100%   BMI 37.91 kg/m   Wt Readings from Last 3 Encounters:  08/05/22 103.3 kg (227 lb 12.8 oz)  07/26/22 101.4 kg (223 lb 9.6 oz)  06/28/22 100.2 kg (221 lb)   PHYSICAL EXAM: General:  NAD. No resp difficulty HEENT: Normal Neck: Supple. No JVD. Carotids 2+ bilat; no bruits. No lymphadenopathy or thryomegaly  appreciated. Cor: PMI nondisplaced. Regular rate & rhythm. No rubs, gallops or murmurs. Lungs: Clear Abdomen: Obese, nontender, nondistended. No hepatosplenomegaly. No bruits or masses. Good bowel sounds. Extremities: No cyanosis, clubbing, rash, edema Neuro: Alert & oriented x 3, cranial nerves grossly intact. Moves all 4 extremities w/o difficulty. Affect pleasant.  ReDs: 43%  ASSESSMENT & PLAN: 1. Chronic Systolic Heart Failure  - Echo (7/23): EF 30-35% RV mildly reduced. LVH. LA severely dilated. RA mildly reduced.  - Unclear etiology though hypertension will certainly play a role. She has not had an ischemic evaluation. Hold off on cath for now. If EF remains low will need to consider.  - We have discussed the fetotoxic risks of her cardiac meds were she to become pregnant.  She is sexually active and currently not trying to prevent pregnancy. For now her boyfriend plans to use condoms.  - Unable to get cMRI w/ no insurance. - NYHA II-III. Volume improving but still elevated, Reds Clip 43%.  - Start Farxiga 10 mg daily. We discussed side effects - Continue Lasix 40 mg daily for now, may be able to pull back with addition of [place]. - Continue carvedilol 12.5 mg bid. - Continue Entresto 97/103 mg bid. - Continue spiro 25 mg daily  - Continue hydralazine 25 mg bid + Imdur 30 mg daily. - Repeat echo in 2-3 months. - Labs today. - Discussed limiting fluids to < 2 L/day.  2. Anemia  - Hgb 9.2 (06/20/22) - Check  anemia panel today.                                                                                                                                       3. HTN  - Elevated.  - Diurese as above. Consider increasing Coreg next. - Given Rx for BP cuff, and asked to check daily + log.    4. Tobacco Abuse  - Discussed cessation.   5. SDOH - Uninsured, HFSW helping with resources. - She may be able to get health insurance thru her work.   Follow up in 2-3 months with Dr. Gala Romney + echo.  Prince Rome, FNP-BC 08/05/22

## 2022-08-19 ENCOUNTER — Ambulatory Visit (HOSPITAL_COMMUNITY)
Admission: RE | Admit: 2022-08-19 | Discharge: 2022-08-19 | Disposition: A | Discharge status: Home / Self Care | Payer: Self-pay | Source: Ambulatory Visit | Attending: Internal Medicine | Admitting: Internal Medicine

## 2022-08-19 DIAGNOSIS — I5022 Chronic systolic (congestive) heart failure: Secondary | ICD-10-CM

## 2022-08-19 LAB — BASIC METABOLIC PANEL
Anion gap: 9 (ref 5–15)
BUN: 21 mg/dL — ABNORMAL HIGH (ref 6–20)
CO2: 23 mmol/L (ref 22–32)
Calcium: 9.1 mg/dL (ref 8.9–10.3)
Chloride: 106 mmol/L (ref 98–111)
Creatinine, Ser: 1.45 mg/dL — ABNORMAL HIGH (ref 0.44–1.00)
GFR, Estimated: 47 mL/min — ABNORMAL LOW (ref >60)
Glucose, Bld: 89 mg/dL (ref 70–99)
Potassium: 3.7 mmol/L (ref 3.5–5.1)
Sodium: 138 mmol/L (ref 135–145)

## 2022-08-21 ENCOUNTER — Other Ambulatory Visit (HOSPITAL_COMMUNITY): Payer: Self-pay

## 2022-08-22 ENCOUNTER — Other Ambulatory Visit (HOSPITAL_COMMUNITY): Payer: Self-pay

## 2022-09-06 ENCOUNTER — Other Ambulatory Visit (HOSPITAL_COMMUNITY): Payer: Self-pay

## 2022-10-31 ENCOUNTER — Other Ambulatory Visit (HOSPITAL_COMMUNITY): Payer: Self-pay

## 2022-11-09 ENCOUNTER — Encounter (HOSPITAL_COMMUNITY): Payer: Self-pay | Admitting: Internal Medicine

## 2022-11-09 ENCOUNTER — Ambulatory Visit (HOSPITAL_COMMUNITY): Admission: RE | Admit: 2022-11-09 | Payer: Self-pay | Source: Ambulatory Visit

## 2022-12-28 ENCOUNTER — Ambulatory Visit: Payer: Self-pay | Attending: Internal Medicine | Admitting: Internal Medicine

## 2022-12-29 ENCOUNTER — Other Ambulatory Visit (HOSPITAL_COMMUNITY): Payer: Self-pay

## 2023-01-23 ENCOUNTER — Other Ambulatory Visit: Payer: Self-pay

## 2023-01-23 ENCOUNTER — Other Ambulatory Visit (HOSPITAL_COMMUNITY): Payer: Self-pay

## 2023-01-28 ENCOUNTER — Other Ambulatory Visit (HOSPITAL_COMMUNITY): Payer: Self-pay

## 2023-01-31 ENCOUNTER — Other Ambulatory Visit (HOSPITAL_COMMUNITY): Payer: Self-pay

## 2023-02-16 ENCOUNTER — Other Ambulatory Visit: Payer: Self-pay

## 2023-02-16 ENCOUNTER — Other Ambulatory Visit (HOSPITAL_COMMUNITY): Payer: Self-pay

## 2023-02-22 ENCOUNTER — Other Ambulatory Visit (HOSPITAL_COMMUNITY): Payer: Self-pay

## 2023-02-23 ENCOUNTER — Other Ambulatory Visit (HOSPITAL_COMMUNITY): Payer: Self-pay

## 2023-02-23 ENCOUNTER — Other Ambulatory Visit: Payer: Self-pay

## 2023-03-01 ENCOUNTER — Other Ambulatory Visit (HOSPITAL_COMMUNITY): Payer: Self-pay | Admitting: Cardiology

## 2023-03-01 ENCOUNTER — Other Ambulatory Visit (HOSPITAL_COMMUNITY): Payer: Self-pay

## 2023-03-01 MED ORDER — CARVEDILOL 12.5 MG PO TABS
12.5000 mg | ORAL_TABLET | Freq: Two times a day (BID) | ORAL | 1 refills | Status: DC
  Filled 2023-03-01: qty 60, 30d supply, fill #0
  Filled 2023-03-27: qty 60, 30d supply, fill #1

## 2023-03-01 MED ORDER — SPIRONOLACTONE 25 MG PO TABS
25.0000 mg | ORAL_TABLET | Freq: Every day | ORAL | 1 refills | Status: DC
  Filled 2023-03-01: qty 30, 30d supply, fill #0
  Filled 2023-03-27: qty 30, 30d supply, fill #1

## 2023-03-01 MED ORDER — DAPAGLIFLOZIN PROPANEDIOL 10 MG PO TABS
10.0000 mg | ORAL_TABLET | Freq: Every day | ORAL | 1 refills | Status: DC
  Filled 2023-03-01 – 2023-03-27 (×2): qty 30, 30d supply, fill #0

## 2023-03-01 MED ORDER — ISOSORBIDE MONONITRATE ER 30 MG PO TB24
30.0000 mg | ORAL_TABLET | Freq: Every day | ORAL | 1 refills | Status: DC
  Filled 2023-03-01: qty 30, 30d supply, fill #0
  Filled 2023-03-27: qty 30, 30d supply, fill #1

## 2023-03-01 MED ORDER — SACUBITRIL-VALSARTAN 97-103 MG PO TABS
1.0000 | ORAL_TABLET | Freq: Two times a day (BID) | ORAL | 1 refills | Status: DC
  Filled 2023-03-01 – 2023-03-27 (×2): qty 60, 30d supply, fill #0
  Filled 2023-04-29 – 2023-05-20 (×2): qty 60, 30d supply, fill #1

## 2023-03-01 MED ORDER — HYDRALAZINE HCL 25 MG PO TABS
25.0000 mg | ORAL_TABLET | Freq: Two times a day (BID) | ORAL | 1 refills | Status: DC
  Filled 2023-03-01: qty 60, 30d supply, fill #0
  Filled 2023-03-27: qty 60, 30d supply, fill #1

## 2023-03-01 MED ORDER — FUROSEMIDE 40 MG PO TABS
40.0000 mg | ORAL_TABLET | Freq: Every day | ORAL | 1 refills | Status: DC
  Filled 2023-03-01: qty 30, 30d supply, fill #0
  Filled 2023-03-27: qty 30, 30d supply, fill #1

## 2023-03-01 MED ORDER — POTASSIUM CHLORIDE CRYS ER 20 MEQ PO TBCR
20.0000 meq | EXTENDED_RELEASE_TABLET | Freq: Every day | ORAL | 0 refills | Status: DC
  Filled 2023-03-01 – 2023-03-27 (×2): qty 3, 3d supply, fill #0

## 2023-03-01 NOTE — Telephone Encounter (Signed)
Pt left VM on pharmacy line as below:  left VM saying her legs are swollen and she has a bloated stomach and some chest pain. Says she asked for refills of her heart failure meds via mychart a few days ago and nothing has been done. She wants to know what to do/how to get her medications. Says if she doesn't hear back by 5pm today, she plans to go to the ED.   Returned call to patient Reports she has been without all of her medications for some time while waiting on insurance. Insurance has now been reactivated. Reports she has legs swelling and SOB. Without refills feels she is nearing the need to report to the ER. Denied active CP at the time of the call just felt bloated.   Refilled all medications-strongly advised to keep upcoming appt (unclear where original refill requests were sent) Will forward to provider in case further adjustments/work in appt are needed etc

## 2023-03-01 NOTE — Telephone Encounter (Signed)
Pt aware and voiced understanding 

## 2023-03-02 ENCOUNTER — Other Ambulatory Visit (HOSPITAL_COMMUNITY): Payer: Self-pay

## 2023-03-10 ENCOUNTER — Other Ambulatory Visit (HOSPITAL_COMMUNITY): Payer: Self-pay

## 2023-03-27 ENCOUNTER — Other Ambulatory Visit (HOSPITAL_COMMUNITY): Payer: Self-pay

## 2023-03-31 ENCOUNTER — Other Ambulatory Visit (HOSPITAL_COMMUNITY): Payer: Self-pay

## 2023-03-31 ENCOUNTER — Encounter (HOSPITAL_COMMUNITY): Payer: Self-pay | Admitting: Internal Medicine

## 2023-03-31 ENCOUNTER — Ambulatory Visit (HOSPITAL_BASED_OUTPATIENT_CLINIC_OR_DEPARTMENT_OTHER)
Admission: RE | Admit: 2023-03-31 | Discharge: 2023-03-31 | Disposition: A | Discharge status: Home / Self Care | Payer: BC Managed Care – PPO | Source: Ambulatory Visit | Attending: Internal Medicine | Admitting: Internal Medicine

## 2023-03-31 ENCOUNTER — Telehealth (HOSPITAL_COMMUNITY): Payer: Self-pay

## 2023-03-31 ENCOUNTER — Ambulatory Visit (HOSPITAL_COMMUNITY)
Admission: RE | Admit: 2023-03-31 | Discharge: 2023-03-31 | Disposition: A | Discharge status: Home / Self Care | Payer: BC Managed Care – PPO | Source: Ambulatory Visit | Attending: Adult Health | Admitting: Adult Health

## 2023-03-31 ENCOUNTER — Other Ambulatory Visit: Payer: Self-pay

## 2023-03-31 VITALS — BP 118/70 | HR 88 | Wt 238.0 lb

## 2023-03-31 DIAGNOSIS — D508 Other iron deficiency anemias: Secondary | ICD-10-CM | POA: Diagnosis not present

## 2023-03-31 DIAGNOSIS — N1831 Chronic kidney disease, stage 3a: Secondary | ICD-10-CM | POA: Diagnosis not present

## 2023-03-31 DIAGNOSIS — D509 Iron deficiency anemia, unspecified: Secondary | ICD-10-CM | POA: Insufficient documentation

## 2023-03-31 DIAGNOSIS — I080 Rheumatic disorders of both mitral and aortic valves: Secondary | ICD-10-CM | POA: Diagnosis not present

## 2023-03-31 DIAGNOSIS — I130 Hypertensive heart and chronic kidney disease with heart failure and stage 1 through stage 4 chronic kidney disease, or unspecified chronic kidney disease: Secondary | ICD-10-CM | POA: Insufficient documentation

## 2023-03-31 DIAGNOSIS — I5022 Chronic systolic (congestive) heart failure: Secondary | ICD-10-CM | POA: Insufficient documentation

## 2023-03-31 DIAGNOSIS — R0683 Snoring: Secondary | ICD-10-CM | POA: Diagnosis not present

## 2023-03-31 DIAGNOSIS — Z79899 Other long term (current) drug therapy: Secondary | ICD-10-CM | POA: Insufficient documentation

## 2023-03-31 DIAGNOSIS — I10 Essential (primary) hypertension: Secondary | ICD-10-CM | POA: Diagnosis not present

## 2023-03-31 DIAGNOSIS — Z720 Tobacco use: Secondary | ICD-10-CM | POA: Insufficient documentation

## 2023-03-31 LAB — CBC
HCT: 37.7 % (ref 36.0–46.0)
Hemoglobin: 11.9 g/dL — ABNORMAL LOW (ref 12.0–15.0)
MCH: 25.5 pg — ABNORMAL LOW (ref 26.0–34.0)
MCHC: 31.6 g/dL (ref 30.0–36.0)
MCV: 80.9 fL (ref 80.0–100.0)
Platelets: 242 10*3/uL (ref 150–400)
RBC: 4.66 MIL/uL (ref 3.87–5.11)
RDW: 17.2 % — ABNORMAL HIGH (ref 11.5–15.5)
WBC: 9.1 10*3/uL (ref 4.0–10.5)
nRBC: 0 % (ref 0.0–0.2)

## 2023-03-31 LAB — ECHOCARDIOGRAM COMPLETE
AR max vel: 1.86 cm2
AV Area VTI: 1.88 cm2
AV Area mean vel: 1.91 cm2
AV Mean grad: 6.5 mmHg
AV Peak grad: 11.8 mmHg
Ao pk vel: 1.72 m/s
Area-P 1/2: 8.16 cm2
Calc EF: 40.3 %
Est EF: 45
MV VTI: 1.76 cm2
P 1/2 time: 267 msec
S' Lateral: 4.4 cm
Single Plane A2C EF: 36.5 %
Single Plane A4C EF: 44.3 %

## 2023-03-31 LAB — FERRITIN: Ferritin: 18 ng/mL (ref 11–307)

## 2023-03-31 LAB — BASIC METABOLIC PANEL
Anion gap: 10 (ref 5–15)
BUN: 22 mg/dL — ABNORMAL HIGH (ref 6–20)
CO2: 23 mmol/L (ref 22–32)
Calcium: 9 mg/dL (ref 8.9–10.3)
Chloride: 105 mmol/L (ref 98–111)
Creatinine, Ser: 1.25 mg/dL — ABNORMAL HIGH (ref 0.44–1.00)
GFR, Estimated: 56 mL/min — ABNORMAL LOW (ref >60)
Glucose, Bld: 92 mg/dL (ref 70–99)
Potassium: 4.2 mmol/L (ref 3.5–5.1)
Sodium: 138 mmol/L (ref 135–145)

## 2023-03-31 LAB — IRON AND TIBC
Iron: 56 ug/dL (ref 28–170)
Saturation Ratios: 14 % (ref 10.4–31.8)
TIBC: 410 ug/dL (ref 250–450)
UIBC: 354 ug/dL

## 2023-03-31 LAB — BRAIN NATRIURETIC PEPTIDE: B Natriuretic Peptide: 153.3 pg/mL — ABNORMAL HIGH (ref 0.0–100.0)

## 2023-03-31 MED ORDER — EMPAGLIFLOZIN 10 MG PO TABS: 10.0000 mg | ORAL_TABLET | Freq: Every day | ORAL | 11 refills | Status: AC

## 2023-03-31 NOTE — Patient Instructions (Signed)
STOP Farxgia   START Jardiance 10 mg daily.  Labs done today, your results will be available in MyChart, we will contact you for abnormal readings.   Your provider has recommended that you have a home sleep study ([facility] Test).  We have provided you with the equipment in our office today. Please go ahead and download the app. DO NOT OPEN OR TAMPER WITH THE BOX UNTIL WE ADVISE YOU TO DO SO. Once insurance has approved the test our office will call you with PIN number and approval to proceed with testing. Once you have completed the test you just dispose of the equipment, the information is automatically uploaded to [place] via blue-tooth technology. If your test is positive for sleep apnea and you need a home CPAP machine you will be contacted by Dr Norris Cross office [facility]) to set this up.   Your physician has requested that you have a cardiac MRI. Cardiac MRI uses a computer to create images of your heart as its beating, producing both still and moving pictures of your heart and major blood vessels. For further information please visit [email or web address]. Please follow the instruction sheet given to you today for more information. ONCE APPROVED BY YOUR INSURANCE COMPANY YOU WILL BE CALLED TO HAVE THIS TEST ARRANGED.  You have been referred to Internal Medicine for your Primary Care. They will call you to arrange your appointment.  Your physician recommends that you schedule a follow-up appointment in: 3 months.  If you have any questions or concerns before your next appointment please send [place] a message through [place] or call our office at [phone].    TO LEAVE A MESSAGE FOR THE NURSE SELECT OPTION 2, PLEASE LEAVE A MESSAGE INCLUDING: YOUR NAME DATE OF BIRTH CALL BACK NUMBER REASON FOR CALL**this is important as we prioritize the call backs  YOU WILL RECEIVE A CALL BACK THE SAME DAY AS LONG AS YOU CALL BEFORE 4:00 PM At the [facility], you and your health needs are  our priority. As part of our continuing mission to provide you with exceptional heart care, we have created designated Provider Care Teams. These Care Teams include your primary Cardiologist (physician) and Advanced Practice Providers (APPs- Physician Assistants and Nurse Practitioners) who all work together to provide you with the care you need, when you need it.   You may see any of the following providers on your designated Care Team at your next follow up: Dr Arvilla Meres Dr Marca Ancona Dr. Marcos Eke, NP Robbie Lis, [place] [facility] [place], [place] Brynda Peon, NP Karle Plumber, PharmD   Please be sure to bring in all your medications bottles to every appointment.    Thank you for choosing  [facility]

## 2023-03-31 NOTE — Progress Notes (Signed)
Height:     Weight: BMI:  Today's Date:  STOP BANG RISK ASSESSMENT S (snore) Have you been told that you snore?     YES   T (tired) Are you often tired, fatigued, or sleepy during the day?   YES  O (obstruction) Do you stop breathing, choke, or gasp during sleep? YES   P (pressure) Do you have or are you being treated for high blood pressure? YES   B (BMI) Is your body index greater than 35 kg/m? YES   A (age) Are you 39 years old or older? NO   N (neck) Do you have a neck circumference greater than 16 inches?   NO   G (gender) Are you a female? NO   TOTAL STOP/BANG "YES" ANSWERS 5                                                                       For Office Use Only              Procedure Order Form    YES to 3+ Stop Bang questions OR two clinical symptoms - patient qualifies for WatchPAT (CPT 95800)      Clinical Notes: Will consult Sleep Specialist and refer for management of therapy due to patient increased risk of Sleep Apnea. Ordering a sleep study due to the following two clinical symptoms: Excessive daytime sleepiness G47.10 / Gastroesophageal reflux K21.9 / Nocturia R35.1 / Morning Headaches G44.221 / Difficulty concentrating R41.840 / Memory problems or poor judgment G31.84 / Personality changes or irritability R45.4 / Loud snoring R06.83 / Depression F32.9 / Unrefreshed by sleep G47.8 / Impotence N52.9 / History of high blood pressure R03.0 / Insomnia G47.00       

## 2023-03-31 NOTE — Telephone Encounter (Signed)
Spoke to patient ,aware she can proceed with home sleep study. No precertification needed.

## 2023-03-31 NOTE — Progress Notes (Incomplete)
HEART & VASCULAR TRANSITION OF CARE CONSULT NOTE     Referring Physician: Dr Rennis Golden Primary Care: None  Primary Cardiologist:None  HPI:  Robin Dudley is a 37 y/o with morbid obesity, severe HTN, tobacco abuse and chronic HFrEF. Referred by Dr. Rennis Golden for HF evaluation   Admitted 12/24/21 for acute systolic heart failure.  She was discharged on Lasix 20 mg daily and lisinopril 10 mg daily. Echo from 12/24/2021 with LVEF 30 to 35%, global hypokinesis, severe concentric LVH, normal RV, severe LAE, moderate to severe mitral regurgitation with EROA 0.3cm2, RVol 48mL. Likely functional due to annular dilation in the setting of severe LA enlargement.  Small pericardial effusion.  Mild to moderate aortic regurgitation.  Admitted 06/17/22 with A/C HFrEF. Diuresed with IV lasix and transitioned to lasix 40 mg po daily. Discharged on hydralazine/imdur, losartan, carvedilol, and spiro. Discharge weight 06/20/22.   Here with her boyfriend. Works at [employer]. Was going to gym regularly but now going once a week. Does TM and elliptical. Doesn't get SOB much unless she runs out of her meds. Has been complaint over past month. Not checking BP regularly. Smoking 1/2 ppd.   Echo today 03/31/23 EF 40-45%. Severe LVH/LAE. Moderate to severe MR, mild-mod AI    Cardiac Testing  06/2022 Echo EF 30-35% RV mildly reduced.  12/2021 Echo Ef 30-35% RV normal   Review of Systems: [y] = yes, [ ]  = no   General: Weight gain [ ] ; Weight loss [ ] ; Anorexia [ ] ; Fatigue [ ] ; Fever [ ] ; Chills [ ] ; Weakness [ ]   Cardiac: Chest pain/pressure [ ] ; Resting SOB [ ] ; Exertional SOB [ Y]; Orthopnea [ ] ; Pedal Edema [ ] ; Palpitations [ ] ; Syncope [ ] ; Presyncope [ ] ; Paroxysmal nocturnal dyspnea[ ]   Pulmonary: Cough [ ] ; Wheezing[ ] ; Hemoptysis[ ] ; Sputum [ ] ; Snoring [ ]   GI: Vomiting[ ] ; Dysphagia[ ] ; Melena[ ] ; Hematochezia [ ] ; Heartburn[ ] ; Abdominal pain [ ] ; Constipation [ ] ; Diarrhea [ ] ; BRBPR [ ]   GU: Hematuria[ ] ;  Dysuria [ ] ; Nocturia[ ]   Vascular: Pain in legs with walking [ ] ; Pain in feet with lying flat [ ] ; Non-healing sores [ ] ; Stroke [ ] ; TIA [ ] ; Slurred speech [ ] ;  Neuro: Headaches[ ] ; Vertigo[ ] ; Seizures[ ] ; Paresthesias[ ] ;Blurred vision [ ] ; Diplopia [ ] ; Vision changes [ ]   Ortho/Skin: Arthritis [ ] ; Joint pain [ ] ; Muscle pain [ ] ; Joint swelling [ ] ; Back Pain [ ] ; Rash [ ]   Psych: Depression[ ] ; Anxiety[ ]   Heme: Bleeding problems [ ] ; Clotting disorders [ ] ; Anemia [ ]   Endocrine: Diabetes [ ] ; Thyroid dysfunction[ ]    Past Medical History:  Diagnosis Date   Hypertension 12/24/2021    Current Outpatient Medications  Medication Sig Dispense Refill   acetaminophen (TYLENOL) 650 MG CR tablet Take 1,300 mg by mouth daily as needed for pain (leg pain).     carvedilol (COREG) 12.5 MG tablet Take 1 tablet by mouth 2 (two) times daily with a meal. 60 tablet 1   dapagliflozin propanediol (FARXIGA) 10 MG TABS tablet Take 1 tablet (10 mg total) by mouth daily before breakfast. 30 tablet 1   furosemide (LASIX) 40 MG tablet Take 1 tablet (40 mg total) by mouth daily. 30 tablet 1   hydrALAZINE (APRESOLINE) 25 MG tablet Take 1 tablet (25 mg total) by mouth 2 (two) times daily. 60 tablet 1   isosorbide mononitrate (IMDUR) 30 MG 24 hr  tablet Take 1 tablet (30 mg total) by mouth daily. 30 tablet 1   Multiple Vitamin (MULTIVITAMIN PO) Take 1 tablet by mouth 2 (two) times daily.     potassium chloride SA (KLOR-CON M20) 20 MEQ tablet Take 1 tablet (20 mEq) by mouth daily. 3 tablet 0   sacubitril-valsartan (ENTRESTO) 97-103 MG Take 1 tablet by mouth 2 (two) times daily. 60 tablet 1   spironolactone (ALDACTONE) 25 MG tablet Take 1 tablet (25 mg total) by mouth daily. 30 tablet 1   No current facility-administered medications for this encounter.    No Known Allergies    Social History   Socioeconomic History   Marital status: Single    Spouse name: Not on file   Number of children: Not on  file   Years of education: Not on file   Highest education level: Not on file  Occupational History   Not on file  Tobacco Use   Smoking status: Every Day    Packs/day: .5    Types: Cigarettes   Smokeless tobacco: Not on file  Vaping Use   Vaping Use: Never used  Substance and Sexual Activity   Alcohol use: Yes    Comment: Social drinker now. Used to drink   Drug use: Yes    Frequency: 7.0 times per week    Types: Marijuana   Sexual activity: Yes  Other Topics Concern   Not on file  Social History Narrative   Not on file   Social Determinants of Health   Financial Resource Strain: Low Risk  (06/28/2022)   Overall Financial Resource Strain (CARDIA)    Difficulty of Paying Living Expenses: Not hard at all  Food Insecurity: No Food Insecurity (06/28/2022)   Hunger Vital Sign    Worried About Running Out of Food in the Last Year: Never true    Ran Out of Food in the Last Year: Never true  Transportation Needs: No Transportation Needs (06/28/2022)   PRAPARE - Administrator, Civil Service (Medical): No    Lack of Transportation (Non-Medical): No  Physical Activity: Sufficiently Active (06/28/2022)   Exercise Vital Sign    Days of Exercise per Week: 7 days    Minutes of Exercise per Session: 60 min  Stress: No Stress Concern Present (06/28/2022)   [employer] of Occupational Health - Occupational Stress Questionnaire    Feeling of Stress : Not at all  Social Connections: Socially Isolated (06/28/2022)   Social Connection and Isolation Panel [NHANES]    Frequency of Communication with Friends and Family: Never    Frequency of Social Gatherings with Friends and Family: Never    Attends Religious Services: Never    Database administrator or Organizations: No    Attends Engineer, structural: Never    Marital Status: Living with partner  Intimate Partner Violence: Not on file      Family History  Problem Relation Age of Onset   Diabetes Mother     Hyperlipidemia Mother    Heart failure Maternal Aunt    Heart failure Maternal Grandmother     Vitals:   03/31/23 0907  BP: 118/70  Pulse: 88  SpO2: 99%  Weight: 108 kg (238 lb)    PHYSICAL EXAM: General:  Well appearing. No resp difficulty HEENT: normal Neck: supple. no JVD. Carotids 2+ bilat; no bruits. No lymphadenopathy or thryomegaly appreciated. Cor: PMI nondisplaced. Regular rate & rhythm. No rubs, gallops or murmurs. Lungs: clear Abdomen: obese soft, nontender,  nondistended. No hepatosplenomegaly. No bruits or masses. Good bowel sounds. Extremities: no cyanosis, clubbing, rash, edema Neuro: alert & orientedx3, cranial nerves grossly intact. moves all 4 extremities w/o difficulty. Affect pleasant   ECG: NSR 84 bpm QRS 80 Robin    ASSESSMENT & PLAN:  1. Chronic HFrEF - Echo EF 30-35% RV mildly reduced . LVH. LA severely dilated. RA mildly reduced.  - Echo today 03/31/23 EF 40-45%. Severe LVH/LAE. Moderate to severe MR, mild-mod AI  - Based on echo suspect severe LVH is etiology but valves also quite thick and raises suspicion for infiltrative process. Will check cMRI  -Continue carvedilol 12.5 mg twice a day. - Continue entresto 49-51 mg twice a day .  - Continue spiro 25 mg daily  - Start Jardiance  - Continue hydralazine 25 mg twice a day  - Continue imdur 30 mg daily              2. Severe HTN  Elevated. Switch from losartan to entresto.   3. Tobacco Abuse Discussed cessation.   4. CKD 3a - B/l Scr 1.3-1.4  5. Microcytic anemia - like due to menses - probably needs IV iron (would hold until after cMRI)  6. Morbid obesity   7. Heavy snoring - suspect severe OSA - check home sleep study   Arvilla Meres, MD  9:19 AM

## 2023-03-31 NOTE — Telephone Encounter (Signed)
Advanced [facility] Patient Advocate Encounter  Prior authorization for Jardiance submitted and APPROVED. Test billing returns $50 copay for 30 day supply.  [facility] Effective: 03/31/2023 - 03/30/2024  Burnell Blanks, CPhT Rx Patient Advocate Phone: [phone]

## 2023-04-03 ENCOUNTER — Other Ambulatory Visit (HOSPITAL_COMMUNITY): Payer: Self-pay

## 2023-04-29 ENCOUNTER — Other Ambulatory Visit (HOSPITAL_COMMUNITY): Payer: Self-pay | Admitting: Family Medicine

## 2023-05-01 ENCOUNTER — Other Ambulatory Visit: Payer: Self-pay

## 2023-05-01 ENCOUNTER — Other Ambulatory Visit (HOSPITAL_COMMUNITY): Payer: Self-pay

## 2023-05-01 MED ORDER — POTASSIUM CHLORIDE CRYS ER 20 MEQ PO TBCR
20.0000 meq | EXTENDED_RELEASE_TABLET | Freq: Every day | ORAL | 0 refills | Status: DC
  Filled 2023-05-01 – 2023-05-20 (×2): qty 3, 3d supply, fill #0

## 2023-05-01 MED ORDER — SPIRONOLACTONE 25 MG PO TABS
25.0000 mg | ORAL_TABLET | Freq: Every day | ORAL | 1 refills | Status: DC
  Filled 2023-05-01 – 2023-05-20 (×2): qty 30, 30d supply, fill #0
  Filled 2023-06-30: qty 30, 30d supply, fill #1

## 2023-05-01 MED ORDER — CARVEDILOL 12.5 MG PO TABS
12.5000 mg | ORAL_TABLET | Freq: Two times a day (BID) | ORAL | 1 refills | Status: DC
  Filled 2023-05-01 – 2023-05-20 (×2): qty 60, 30d supply, fill #0
  Filled 2023-06-30: qty 60, 30d supply, fill #1

## 2023-05-01 MED ORDER — ISOSORBIDE MONONITRATE ER 30 MG PO TB24
30.0000 mg | ORAL_TABLET | Freq: Every day | ORAL | 1 refills | Status: DC
  Filled 2023-05-01 – 2023-05-20 (×2): qty 30, 30d supply, fill #0
  Filled 2023-06-30: qty 30, 30d supply, fill #1

## 2023-05-01 MED ORDER — FUROSEMIDE 40 MG PO TABS
40.0000 mg | ORAL_TABLET | Freq: Every day | ORAL | 1 refills | Status: DC
  Filled 2023-05-01 – 2023-05-20 (×2): qty 30, 30d supply, fill #0
  Filled 2023-06-30: qty 30, 30d supply, fill #1

## 2023-05-01 MED ORDER — HYDRALAZINE HCL 25 MG PO TABS
25.0000 mg | ORAL_TABLET | Freq: Two times a day (BID) | ORAL | 1 refills | Status: DC
  Filled 2023-05-01 – 2023-05-20 (×2): qty 60, 30d supply, fill #0
  Filled 2023-06-30: qty 60, 30d supply, fill #1

## 2023-05-02 ENCOUNTER — Other Ambulatory Visit (HOSPITAL_COMMUNITY): Payer: Self-pay

## 2023-05-09 ENCOUNTER — Other Ambulatory Visit (HOSPITAL_COMMUNITY): Payer: Self-pay

## 2023-05-20 ENCOUNTER — Other Ambulatory Visit (HOSPITAL_COMMUNITY): Payer: Self-pay

## 2023-06-22 ENCOUNTER — Telehealth (HOSPITAL_COMMUNITY): Payer: Self-pay | Admitting: Cardiology

## 2023-06-22 ENCOUNTER — Encounter (HOSPITAL_COMMUNITY): Payer: Self-pay

## 2023-06-22 ENCOUNTER — Telehealth (HOSPITAL_COMMUNITY): Payer: Self-pay | Admitting: *Deleted

## 2023-06-22 NOTE — Telephone Encounter (Signed)
Reaching out to patient to offer assistance regarding upcoming cardiac imaging study; pt verbalizes understanding of appt date/time, parking situation and where to check in, and verified current allergies; name and call back number provided for further questions should they arise ? ?Ronisha Herringshaw RN Navigator Cardiac Imaging ? Heart and Vascular ?[phone] office ?[phone] cell ? ?Patient denies metal or claustrophobia. ?

## 2023-06-22 NOTE — Telephone Encounter (Signed)
[id number] Case# [id number] Valid 06/22/23-12/19/23 Cpt code 13244

## 2023-06-23 ENCOUNTER — Encounter (HOSPITAL_COMMUNITY): Payer: Self-pay

## 2023-06-23 ENCOUNTER — Ambulatory Visit (HOSPITAL_COMMUNITY): Admission: RE | Admit: 2023-06-23 | Payer: BC Managed Care – PPO | Source: Ambulatory Visit

## 2023-06-29 ENCOUNTER — Telehealth (HOSPITAL_COMMUNITY): Payer: Self-pay

## 2023-06-29 NOTE — Telephone Encounter (Signed)
Called to confirm/remind patient of their appointment at the [facility] on 06/30/23.   Patient reminded to bring all medications and/or complete list.  Confirmed patient has transportation. Gave directions, instructed to utilize valet parking.  Confirmed appointment prior to ending call.

## 2023-06-30 ENCOUNTER — Other Ambulatory Visit (HOSPITAL_COMMUNITY): Payer: Self-pay | Admitting: Family Medicine

## 2023-06-30 ENCOUNTER — Other Ambulatory Visit: Payer: Self-pay

## 2023-06-30 ENCOUNTER — Encounter (HOSPITAL_COMMUNITY): Payer: BC Managed Care – PPO

## 2023-06-30 ENCOUNTER — Other Ambulatory Visit (HOSPITAL_COMMUNITY): Payer: Self-pay

## 2023-06-30 MED ORDER — POTASSIUM CHLORIDE CRYS ER 20 MEQ PO TBCR
20.0000 meq | EXTENDED_RELEASE_TABLET | Freq: Every day | ORAL | 0 refills | Status: DC
  Filled 2023-06-30: qty 3, 3d supply, fill #0

## 2023-07-01 ENCOUNTER — Other Ambulatory Visit (HOSPITAL_COMMUNITY): Payer: Self-pay

## 2023-07-03 ENCOUNTER — Other Ambulatory Visit (HOSPITAL_COMMUNITY): Payer: Self-pay | Admitting: Family Medicine

## 2023-07-03 ENCOUNTER — Other Ambulatory Visit (HOSPITAL_COMMUNITY): Payer: Self-pay | Admitting: Internal Medicine

## 2023-07-03 ENCOUNTER — Other Ambulatory Visit (HOSPITAL_COMMUNITY): Payer: Self-pay

## 2023-07-03 MED ORDER — POTASSIUM CHLORIDE CRYS ER 20 MEQ PO TBCR
20.0000 meq | EXTENDED_RELEASE_TABLET | Freq: Every day | ORAL | 3 refills | Status: DC
  Filled 2023-07-03 – 2023-08-01 (×4): qty 30, 30d supply, fill #0
  Filled 2023-09-04 – 2024-01-05 (×10): qty 30, 30d supply, fill #1
  Filled 2024-02-02: qty 30, 30d supply, fill #2

## 2023-07-03 MED ORDER — ENTRESTO 97-103 MG PO TABS
1.0000 | ORAL_TABLET | Freq: Two times a day (BID) | ORAL | 11 refills | Status: DC
  Filled 2024-02-13: qty 60, 30d supply, fill #0

## 2023-07-04 ENCOUNTER — Encounter (HOSPITAL_COMMUNITY): Payer: BC Managed Care – PPO

## 2023-07-11 ENCOUNTER — Other Ambulatory Visit (HOSPITAL_COMMUNITY): Payer: Self-pay

## 2023-07-12 ENCOUNTER — Other Ambulatory Visit (HOSPITAL_COMMUNITY): Payer: Self-pay

## 2023-07-20 ENCOUNTER — Other Ambulatory Visit (HOSPITAL_COMMUNITY): Payer: Self-pay

## 2023-07-21 ENCOUNTER — Other Ambulatory Visit (HOSPITAL_COMMUNITY): Payer: Self-pay

## 2023-07-21 ENCOUNTER — Other Ambulatory Visit: Payer: Self-pay

## 2023-07-24 ENCOUNTER — Other Ambulatory Visit (HOSPITAL_COMMUNITY): Payer: Self-pay | Admitting: Family Medicine

## 2023-07-25 ENCOUNTER — Other Ambulatory Visit (HOSPITAL_COMMUNITY): Payer: Self-pay

## 2023-07-25 MED ORDER — HYDRALAZINE HCL 25 MG PO TABS
25.0000 mg | ORAL_TABLET | Freq: Two times a day (BID) | ORAL | 0 refills | Status: DC
  Filled 2023-07-25 – 2023-08-05 (×2): qty 60, 30d supply, fill #0
  Filled 2023-09-04 – 2024-01-05 (×10): qty 60, 30d supply, fill #1
  Filled 2024-02-02: qty 60, 30d supply, fill #2

## 2023-07-25 MED ORDER — ISOSORBIDE MONONITRATE ER 30 MG PO TB24
30.0000 mg | ORAL_TABLET | Freq: Every day | ORAL | 0 refills | Status: DC
  Filled 2023-07-25 – 2023-08-05 (×2): qty 30, 30d supply, fill #0
  Filled 2023-09-04 – 2024-01-05 (×10): qty 30, 30d supply, fill #1
  Filled 2024-02-02: qty 30, 30d supply, fill #2

## 2023-07-25 MED ORDER — CARVEDILOL 12.5 MG PO TABS
12.5000 mg | ORAL_TABLET | Freq: Two times a day (BID) | ORAL | 0 refills | Status: DC
  Filled 2023-07-25 – 2023-08-05 (×2): qty 60, 30d supply, fill #0
  Filled 2023-09-04 – 2024-01-05 (×10): qty 60, 30d supply, fill #1
  Filled 2024-02-02: qty 60, 30d supply, fill #2

## 2023-07-25 MED ORDER — SPIRONOLACTONE 25 MG PO TABS
25.0000 mg | ORAL_TABLET | Freq: Every day | ORAL | 0 refills | Status: DC
  Filled 2023-07-25 – 2023-08-05 (×2): qty 30, 30d supply, fill #0
  Filled 2023-09-04 – 2024-01-05 (×10): qty 30, 30d supply, fill #1
  Filled 2024-02-02: qty 30, 30d supply, fill #2

## 2023-07-25 MED ORDER — FUROSEMIDE 40 MG PO TABS
40.0000 mg | ORAL_TABLET | Freq: Every day | ORAL | 0 refills | Status: DC
  Filled 2023-07-25 – 2023-08-05 (×2): qty 30, 30d supply, fill #0
  Filled 2023-09-04 – 2024-01-05 (×10): qty 30, 30d supply, fill #1
  Filled 2024-02-02: qty 30, 30d supply, fill #2

## 2023-07-31 ENCOUNTER — Other Ambulatory Visit (HOSPITAL_COMMUNITY): Payer: Self-pay

## 2023-08-01 ENCOUNTER — Other Ambulatory Visit: Payer: Self-pay

## 2023-08-01 ENCOUNTER — Other Ambulatory Visit (HOSPITAL_COMMUNITY): Payer: Self-pay

## 2023-08-02 ENCOUNTER — Other Ambulatory Visit (HOSPITAL_COMMUNITY): Payer: Self-pay

## 2023-08-04 ENCOUNTER — Other Ambulatory Visit (HOSPITAL_COMMUNITY): Payer: Self-pay

## 2023-08-05 ENCOUNTER — Other Ambulatory Visit (HOSPITAL_COMMUNITY): Payer: Self-pay

## 2023-08-12 ENCOUNTER — Other Ambulatory Visit (HOSPITAL_COMMUNITY): Payer: Self-pay

## 2023-09-13 ENCOUNTER — Other Ambulatory Visit (HOSPITAL_COMMUNITY): Payer: Self-pay

## 2023-09-25 ENCOUNTER — Other Ambulatory Visit (HOSPITAL_COMMUNITY): Payer: Self-pay

## 2023-10-03 ENCOUNTER — Telehealth (HOSPITAL_COMMUNITY): Payer: Self-pay

## 2023-10-03 NOTE — Telephone Encounter (Signed)
Called to confirm/remind patient of their appointment at the [facility] on 10/04/23.   Patient reminded to bring all medications and/or complete list.  Confirmed patient has transportation. Gave directions, instructed to utilize valet parking.  Confirmed appointment prior to ending call.

## 2023-10-04 ENCOUNTER — Encounter (HOSPITAL_COMMUNITY): Payer: BC Managed Care – PPO

## 2023-10-07 ENCOUNTER — Other Ambulatory Visit (HOSPITAL_COMMUNITY): Payer: Self-pay

## 2023-10-19 ENCOUNTER — Other Ambulatory Visit (HOSPITAL_COMMUNITY): Payer: Self-pay

## 2023-10-31 ENCOUNTER — Other Ambulatory Visit (HOSPITAL_COMMUNITY): Payer: Self-pay

## 2023-11-11 ENCOUNTER — Other Ambulatory Visit (HOSPITAL_COMMUNITY): Payer: Self-pay

## 2023-11-25 ENCOUNTER — Other Ambulatory Visit (HOSPITAL_COMMUNITY): Payer: Self-pay

## 2023-12-07 ENCOUNTER — Other Ambulatory Visit (HOSPITAL_COMMUNITY): Payer: Self-pay

## 2023-12-11 ENCOUNTER — Other Ambulatory Visit (HOSPITAL_COMMUNITY): Payer: Self-pay

## 2023-12-22 ENCOUNTER — Other Ambulatory Visit (HOSPITAL_COMMUNITY): Payer: Self-pay

## 2024-01-08 ENCOUNTER — Other Ambulatory Visit (HOSPITAL_COMMUNITY): Payer: Self-pay

## 2024-02-13 ENCOUNTER — Other Ambulatory Visit (HOSPITAL_COMMUNITY): Payer: Self-pay

## 2024-02-27 ENCOUNTER — Other Ambulatory Visit (HOSPITAL_COMMUNITY): Payer: Self-pay

## 2024-03-04 ENCOUNTER — Other Ambulatory Visit (HOSPITAL_COMMUNITY): Payer: Self-pay | Admitting: Family Medicine

## 2024-03-06 ENCOUNTER — Other Ambulatory Visit (HOSPITAL_COMMUNITY): Payer: Self-pay

## 2024-03-06 MED ORDER — HYDRALAZINE HCL 25 MG PO TABS: 25.0000 mg | ORAL_TABLET | Freq: Two times a day (BID) | ORAL | 0 refills | Status: DC

## 2024-03-06 MED ORDER — ISOSORBIDE MONONITRATE ER 30 MG PO TB24
30.0000 mg | ORAL_TABLET | Freq: Every day | ORAL | 0 refills | Status: DC
  Filled 2024-03-30: qty 30, 30d supply, fill #0
  Filled 2024-10-21: qty 30, 30d supply, fill #1
  Filled 2024-11-16: qty 30, 30d supply, fill #2

## 2024-03-06 MED ORDER — SPIRONOLACTONE 25 MG PO TABS: 25.0000 mg | ORAL_TABLET | Freq: Every day | ORAL | 0 refills | Status: DC

## 2024-03-06 MED ORDER — FUROSEMIDE 40 MG PO TABS: 40.0000 mg | ORAL_TABLET | Freq: Every day | ORAL | 0 refills | Status: DC

## 2024-03-06 MED ORDER — CARVEDILOL 12.5 MG PO TABS: 12.5000 mg | ORAL_TABLET | Freq: Two times a day (BID) | ORAL | 0 refills | Status: DC

## 2024-03-08 ENCOUNTER — Other Ambulatory Visit (HOSPITAL_COMMUNITY): Payer: Self-pay

## 2024-03-08 DIAGNOSIS — I5022 Chronic systolic (congestive) heart failure: Secondary | ICD-10-CM

## 2024-03-11 NOTE — Progress Notes (Signed)
 [facility]  NOTE   Primary Care: pending Primary Cardiologist: Dr. Rennis Golden HF Cardiologist: Dr. Gala Romney  HPI: [name] is a 38 y.o. with morbid obesity, severe HTN, tobacco abuse and chronic HFrEF.   Admitted 1/23 CHF exacerbation. Echo 1/23 LVEF 30-35%, global HK, severe concentric LVH, normal RV, severe LAE, mod to severe MR. Mild to mod AI. Re-admitted 7/23 AD[facility].  Echo 4/24 EF 40-45%. Severe LVH/LAE. Moderate to severe MR, mild-mod AI   Last seen 03/2023.  Today she returns for HF follow up. Overall feeling poorly. SOB x 4 months, thought she had a cold during winter, but progressed to cough. SOB walking up steps. Feels bloated. 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 CP when she is SOB. Dizziness and HA off and on x 4 months. Occasional BRBpR. Denies palpitations,  or PND/Orthopnea. Appetite ok but has early satiety. Weight at home 240 pounds. Off Entresto and Jardiance x months Smokes 1/2 ppd, no ETOH, smokes/ingests THC, trying to cut back. She snores. Sexually active, not using contraception but has GYN appt soon.   Echo today 03/13/24, EF ~40% on my read, official MD interpretation pending.  FH: No premature CAD, HF or SCD   Cardiac Testing  - 06/2022 Echo EF 30-35% RV mildly reduced.  - 12/2021 Echo Ef 30-35% RV normal   Past Medical History:  Diagnosis Date   Hypertension 12/24/2021   Current Outpatient Medications  Medication Sig Dispense Refill   acetaminophen (TYLENOL) 650 MG CR tablet Take 1,300 mg by mouth daily as needed for pain (leg pain).     albuterol (VENTOLIN HFA) 108 (90 Base) MCG/ACT inhaler Inhale 2 puffs into the lungs every 6 (six) hours as needed for wheezing or shortness of breath. 6.7 g 2   carvedilol (COREG) 12.5 MG tablet Take 1 tablet (12.5 mg total) by mouth 2 (two) times daily with a meal. NEEDS FOLLOW UP APPOINTMENT FOR MORE REFILLS 180 tablet 0   empagliflozin (JARDIANCE) 10 MG TABS tablet Take 1 tablet  (10 mg total) by mouth daily before breakfast. 90 tablet 1   hydrALAZINE (APRESOLINE) 25 MG tablet Take 1 tablet (25 mg total) by mouth 2 (two) times daily. NEEDS FOLLOW UP APPOINTMENT FOR MORE REFILLS 180 tablet 0   isosorbide mononitrate (IMDUR) 30 MG 24 hr tablet Take 1 tablet (30 mg total) by mouth daily. NEEDS FOLLOW UP APPOINTMENT FOR MORE REFILLS 90 tablet 0   potassium chloride SA (KLOR-CON M20) 20 MEQ tablet Take 1 tablet by mouth daily. 90 tablet 3   sacubitril-valsartan (ENTRESTO) 97-103 MG Take 1 tablet by mouth 2 (two) times daily. 90 tablet 1   spironolactone (ALDACTONE) 25 MG tablet Take 1 tablet (25 mg total) by mouth daily. NEEDS FOLLOW UP APPOINTMENT FOR MORE REFILLS 90 tablet 0   empagliflozin (JARDIANCE) 10 MG TABS tablet Take 1 tablet (10 mg total) by mouth daily before breakfast. (Patient not taking: Reported on 03/13/2024) 30 tablet 11   furosemide (LASIX) 40 MG tablet Take 1 tablet (40 mg total) by mouth daily. NEEDS FOLLOW UP APPOINTMENT FOR MORE REFILLS 90 tablet 2   sacubitril-valsartan (ENTRESTO) 97-103 MG Take 1 tablet by mouth 2 (two) times daily. (Patient not taking: Reported on 03/13/2024) 60 tablet 11   No current facility-administered medications for this encounter.   No Known Allergies  Social History   Socioeconomic History   Marital status: Single    Spouse name: Not on file  Number of children: Not on file   Years of education: Not on file   Highest education level: Not on file  Occupational History   Not on file  Tobacco Use   Smoking status: Every Day    Current packs/day: 0.50    Types: Cigarettes   Smokeless tobacco: Not on file  Vaping Use   Vaping status: Never Used  Substance and Sexual Activity   Alcohol use: Yes    Comment: Social drinker now. Used to drink   Drug use: Yes    Frequency: 7.0 times per week    Types: Marijuana   Sexual activity: Yes  Other Topics Concern   Not on file  Social History Narrative   Not on file   Social  Drivers of Health   Financial Resource Strain: Low Risk  (06/28/2022)   Overall Financial Resource Strain (CARDIA)    Difficulty of Paying Living Expenses: Not hard at all  Food Insecurity: No Food Insecurity (06/28/2022)   Hunger Vital Sign    Worried About Running Out of Food in the Last Year: Never true    Ran Out of Food in the Last Year: Never true  Transportation Needs: No Transportation Needs (06/28/2022)   PRAPARE - Administrator, Civil Service (Medical): No    Lack of Transportation (Non-Medical): No  Physical Activity: Sufficiently Active (06/28/2022)   Exercise Vital Sign    Days of Exercise per Week: 7 days    Minutes of Exercise per Session: 60 min  Stress: No Stress Concern Present (06/28/2022)   [employer] of Occupational Health - Occupational Stress Questionnaire    Feeling of Stress : Not at all  Social Connections: Socially Isolated (06/28/2022)   Social Connection and Isolation Panel [NHANES]    Frequency of Communication with Friends and Family: Never    Frequency of Social Gatherings with Friends and Family: Never    Attends Religious Services: Never    Database administrator or Organizations: No    Attends Engineer, structural: Never    Marital Status: Living with partner  Intimate Partner Violence: Not on file    Family History  Problem Relation Age of Onset   Diabetes Mother    Hyperlipidemia Mother    Heart failure Maternal Aunt    Heart failure Maternal Grandmother    Wt Readings from Last 3 Encounters:  03/13/24 108.1 kg (238 lb 6.4 oz)  03/31/23 108 kg (238 lb)  08/05/22 103.3 kg (227 lb 12.8 oz)   BP (!) 170/120   Pulse 96   Wt 108.1 kg (238 lb 6.4 oz)   SpO2 96%   BMI 39.67 kg/m   PHYSICAL EXAM: General:  NAD. No resp difficulty, walked into clinic HEENT: Normal Neck: Supple. Thick neck, JVP 8-10 Cor: Regular rate & rhythm. No rubs, gallops or murmurs. Lungs: Faint wheeze LUL Abdomen: Soft, obese, nontender,  nondistended.  Extremities: No cyanosis, clubbing, rash, edema Neuro: Alert & oriented x 3, moves all 4 extremities w/o difficulty. Affect pleasant.  ECG (personally reviewed): NSR 87 bpm, QTC 471 msec  ReDs reading: 46%, abnormal  ASSESSMENT & PLAN:  1. Chronic HFrEF - suspect severe LVH is etiology but valves also quite thick and raises suspicion for infiltrative process - Echo EF 30-35% RV mildly reduced . LVH. LA severely dilated. RA mildly reduced.  - Echo 03/31/23 EF 40-45%. Severe LVH/LAE. Moderate to severe MR, mild-mod AI  - EF improving on previous echo, but still had  evidence of mild LV dysfunction and elevated filling pressures - Echo today 03/13/24, EF ~40% on my read, awaiting official MD interpretation. - Arrange CMRI to rule out infiltrative disease - NYHA IIb-III, suspect body habitus and smoking contributing. Volume OK on exam but ReDs up at 46% (? Body habitus contributing)  - Restart Entresto 97/103 mg bid - Restart Jardiance 10 mg daily - Continue Lasix 40 mg daily + 20 KCL daily - Continue carvedilol 12.5 mg bid.  - Continue spiro 25 mg daily.  - Continue hydralazine 25 mg bid + Imdur 30 mg daily. - Discussed fetotoxicity of GDMT. She is not using contraception, has GYN appt soon to discuss. - Labs today. BMET at follow up  2. 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 Severe HTN  - BP uncontrolled. - Restart meds as above. - She has BP cuff at home, I asked her check daily and log; goal < 120/80. - Needs sleep study (see below)  4. Tobacco Abuse - Smoking 1/2 ppd, patches not effective for her. - Discussed cessation.  - Faint wheezes today, given Rx for albuterol inhaler. Needs PCP follow  up  5. CKD 3a - baseline Scr 1.3-1.4. - Restart SGLT2i. - BMET today  6. Microcytic anemia - Check iron panel and  CBC today - Refer to PCP  7. Morbid obesity  - Body mass index is 39.67 kg/m. - discussed need for weight loss - Consider referral to PharmD for GLP1 next   8. Snoring - Suspect OSA - She has home sleep study, I asked her to use it  Follow up in 2-3 weeks with APP and 4 months with Dr. Gala Romney. Given list of PCPs today to establish care.  Robin Rome, FNP-BC 03/13/24

## 2024-03-12 ENCOUNTER — Telehealth (HOSPITAL_COMMUNITY): Payer: Self-pay

## 2024-03-12 NOTE — Telephone Encounter (Signed)
 Called to confirm/remind patient of their appointment at the [facility] on 03/13/24***.   Appointment:   [x] Confirmed  [] Left mess   [] No answer/No voice mail  [] Phone not in service  Patient reminded to bring all medications and/or complete list.  Confirmed patient has transportation. Gave directions, instructed to utilize valet parking.

## 2024-03-13 ENCOUNTER — Ambulatory Visit (HOSPITAL_BASED_OUTPATIENT_CLINIC_OR_DEPARTMENT_OTHER)
Admission: RE | Admit: 2024-03-13 | Discharge: 2024-03-13 | Disposition: A | Discharge status: Home / Self Care | Source: Ambulatory Visit | Attending: Family Medicine | Admitting: Family Medicine

## 2024-03-13 ENCOUNTER — Ambulatory Visit (HOSPITAL_COMMUNITY)
Admission: RE | Admit: 2024-03-13 | Discharge: 2024-03-13 | Disposition: A | Discharge status: Home / Self Care | Source: Ambulatory Visit | Attending: *Deleted | Admitting: *Deleted

## 2024-03-13 ENCOUNTER — Encounter (HOSPITAL_COMMUNITY): Payer: Self-pay

## 2024-03-13 ENCOUNTER — Other Ambulatory Visit (HOSPITAL_COMMUNITY): Payer: Self-pay

## 2024-03-13 ENCOUNTER — Other Ambulatory Visit: Payer: Self-pay

## 2024-03-13 VITALS — BP 170/120 | HR 96 | Wt 238.4 lb

## 2024-03-13 DIAGNOSIS — R062 Wheezing: Secondary | ICD-10-CM | POA: Insufficient documentation

## 2024-03-13 DIAGNOSIS — Z79899 Other long term (current) drug therapy: Secondary | ICD-10-CM | POA: Diagnosis not present

## 2024-03-13 DIAGNOSIS — I5022 Chronic systolic (congestive) heart failure: Secondary | ICD-10-CM

## 2024-03-13 DIAGNOSIS — Z7984 Long term (current) use of oral hypoglycemic drugs: Secondary | ICD-10-CM | POA: Insufficient documentation

## 2024-03-13 DIAGNOSIS — I130 Hypertensive heart and chronic kidney disease with heart failure and stage 1 through stage 4 chronic kidney disease, or unspecified chronic kidney disease: Secondary | ICD-10-CM | POA: Diagnosis not present

## 2024-03-13 DIAGNOSIS — N1831 Chronic kidney disease, stage 3a: Secondary | ICD-10-CM | POA: Diagnosis not present

## 2024-03-13 DIAGNOSIS — I340 Nonrheumatic mitral (valve) insufficiency: Secondary | ICD-10-CM

## 2024-03-13 DIAGNOSIS — R0683 Snoring: Secondary | ICD-10-CM | POA: Diagnosis not present

## 2024-03-13 DIAGNOSIS — Z720 Tobacco use: Secondary | ICD-10-CM

## 2024-03-13 DIAGNOSIS — Z6839 Body mass index (BMI) 39.0-39.9, adult: Secondary | ICD-10-CM | POA: Diagnosis not present

## 2024-03-13 DIAGNOSIS — I10 Essential (primary) hypertension: Secondary | ICD-10-CM

## 2024-03-13 DIAGNOSIS — D509 Iron deficiency anemia, unspecified: Secondary | ICD-10-CM | POA: Diagnosis not present

## 2024-03-13 DIAGNOSIS — I080 Rheumatic disorders of both mitral and aortic valves: Secondary | ICD-10-CM | POA: Diagnosis not present

## 2024-03-13 DIAGNOSIS — F17210 Nicotine dependence, cigarettes, uncomplicated: Secondary | ICD-10-CM | POA: Diagnosis not present

## 2024-03-13 DIAGNOSIS — N183 Chronic kidney disease, stage 3 unspecified: Secondary | ICD-10-CM

## 2024-03-13 DIAGNOSIS — D508 Other iron deficiency anemias: Secondary | ICD-10-CM

## 2024-03-13 LAB — IRON AND TIBC
Iron: 47 ug/dL (ref 28–170)
Saturation Ratios: 16 % (ref 10.4–31.8)
TIBC: 294 ug/dL (ref 250–450)
UIBC: 247 ug/dL

## 2024-03-13 LAB — ECHOCARDIOGRAM COMPLETE
AR max vel: 2.11 cm2
AV Peak grad: 12.1 mmHg
Ao pk vel: 1.74 m/s
Area-P 1/2: 9.25 cm2
Calc EF: 42.8 %
Est EF: 40
MV M vel: 5.61 m/s
MV Peak grad: 125.9 mmHg
MV VTI: 3.69 cm2
P 1/2 time: 452 ms
S' Lateral: 3.8 cm
Single Plane A2C EF: 40.9 %
Single Plane A4C EF: 44 %

## 2024-03-13 LAB — FERRITIN: Ferritin: 103 ng/mL (ref 11–307)

## 2024-03-13 LAB — COMPREHENSIVE METABOLIC PANEL WITH GFR
ALT: 12 U/L (ref 0–44)
AST: 12 U/L — ABNORMAL LOW (ref 15–41)
Albumin: 3.3 g/dL — ABNORMAL LOW (ref 3.5–5.0)
Alkaline Phosphatase: 66 U/L (ref 38–126)
Anion gap: 9 (ref 5–15)
BUN: 17 mg/dL (ref 6–20)
CO2: 25 mmol/L (ref 22–32)
Calcium: 9.1 mg/dL (ref 8.9–10.3)
Chloride: 103 mmol/L (ref 98–111)
Creatinine, Ser: 1.19 mg/dL — ABNORMAL HIGH (ref 0.44–1.00)
GFR, Estimated: 59 mL/min — ABNORMAL LOW (ref >60)
Glucose, Bld: 99 mg/dL (ref 70–99)
Potassium: 4.4 mmol/L (ref 3.5–5.1)
Sodium: 137 mmol/L (ref 135–145)
Total Bilirubin: 0.5 mg/dL (ref 0.0–1.2)
Total Protein: 7.7 g/dL (ref 6.5–8.1)

## 2024-03-13 LAB — TSH: TSH: 1.27 u[IU]/mL (ref 0.350–4.500)

## 2024-03-13 LAB — CBC
HCT: 37.5 % (ref 36.0–46.0)
Hemoglobin: 11.7 g/dL — ABNORMAL LOW (ref 12.0–15.0)
MCH: 25.4 pg — ABNORMAL LOW (ref 26.0–34.0)
MCHC: 31.2 g/dL (ref 30.0–36.0)
MCV: 81.3 fL (ref 80.0–100.0)
Platelets: 341 10*3/uL (ref 150–400)
RBC: 4.61 MIL/uL (ref 3.87–5.11)
RDW: 16.8 % — ABNORMAL HIGH (ref 11.5–15.5)
WBC: 11.2 10*3/uL — ABNORMAL HIGH (ref 4.0–10.5)
nRBC: 0 % (ref 0.0–0.2)

## 2024-03-13 LAB — BRAIN NATRIURETIC PEPTIDE: B Natriuretic Peptide: 78.2 pg/mL (ref 0.0–100.0)

## 2024-03-13 MED ORDER — ALBUTEROL SULFATE HFA 108 (90 BASE) MCG/ACT IN AERS
2.0000 | INHALATION_SPRAY | Freq: Four times a day (QID) | RESPIRATORY_TRACT | 2 refills | Status: AC | PRN
  Filled 2024-03-13 – 2024-03-30 (×2): qty 6.7, 25d supply, fill #0

## 2024-03-13 MED ORDER — SPIRONOLACTONE 25 MG PO TABS
25.0000 mg | ORAL_TABLET | Freq: Every day | ORAL | 0 refills | Status: DC
  Filled 2024-03-13 – 2024-03-30 (×2): qty 30, 30d supply, fill #0
  Filled 2024-10-21: qty 30, 30d supply, fill #1
  Filled 2024-11-16: qty 30, 30d supply, fill #2

## 2024-03-13 MED ORDER — EMPAGLIFLOZIN 10 MG PO TABS: 10.0000 mg | ORAL_TABLET | Freq: Every day | ORAL | 1 refills | Status: AC

## 2024-03-13 MED ORDER — CARVEDILOL 12.5 MG PO TABS
12.5000 mg | ORAL_TABLET | Freq: Two times a day (BID) | ORAL | 1 refills | Status: AC
  Filled 2024-03-13 – 2024-03-30 (×2): qty 60, 30d supply, fill #0
  Filled 2024-10-21: qty 60, 30d supply, fill #1
  Filled 2024-11-16: qty 60, 30d supply, fill #2
  Filled 2024-12-16: qty 60, 30d supply, fill #3

## 2024-03-13 MED ORDER — HYDRALAZINE HCL 25 MG PO TABS
25.0000 mg | ORAL_TABLET | Freq: Two times a day (BID) | ORAL | 0 refills | Status: DC
  Filled 2024-03-13 – 2024-03-30 (×2): qty 60, 30d supply, fill #0
  Filled 2024-10-21: qty 60, 30d supply, fill #1
  Filled 2024-11-16: qty 60, 30d supply, fill #2

## 2024-03-13 MED ORDER — TORSEMIDE 20 MG PO TABS
20.0000 mg | ORAL_TABLET | Freq: Every day | ORAL | 3 refills | Status: DC
  Filled 2024-03-13: qty 30, 30d supply, fill #0

## 2024-03-13 MED ORDER — POTASSIUM CHLORIDE CRYS ER 20 MEQ PO TBCR
20.0000 meq | EXTENDED_RELEASE_TABLET | Freq: Every day | ORAL | 3 refills | Status: AC
  Filled 2024-03-13 – 2024-03-30 (×2): qty 30, 30d supply, fill #0
  Filled 2024-10-21: qty 30, 30d supply, fill #1
  Filled 2024-11-16: qty 30, 30d supply, fill #2
  Filled 2024-12-16: qty 30, 30d supply, fill #3

## 2024-03-13 MED ORDER — FUROSEMIDE 40 MG PO TABS: 40.0000 mg | ORAL_TABLET | Freq: Every day | ORAL | 2 refills | Status: AC

## 2024-03-13 MED ORDER — ENTRESTO 97-103 MG PO TABS: 1.0000 | ORAL_TABLET | Freq: Two times a day (BID) | ORAL | 1 refills | Status: AC

## 2024-03-13 NOTE — Progress Notes (Signed)
 ReDS Vest / Clip - 03/13/24 0900       ReDS Vest / Clip   Station Marker A    Ruler Value 30    ReDS Value Range High volume overload    ReDS Actual Value 46

## 2024-03-13 NOTE — Progress Notes (Signed)
 Medication Samples have been provided to the patient.  Drug name: Sherryll Burger       Strength: 24-26        Qty: 84  LOT: [id number]  Exp.Date:03/2025  Dosing instructions: Patient takes 3 tablet by mouth twice per day.  The patient has been instructed regarding the correct time, dose, and frequency of taking this medication, including desired effects and most common side effects.   [name] 10:41 AM 03/13/2024   Medication Samples have been provided to the patient.  Drug name: Jardiance       Strength: 10 mg        Qty: 28  LOT: [id number]  Exp.Date: 11/2025  Dosing instructions: Patient takes 1 tablet by mouth daily.  The patient has been instructed regarding the correct time, dose, and frequency of taking this medication, including desired effects and most common side effects.   [name] 10:41 AM 03/13/2024

## 2024-03-13 NOTE — Addendum Note (Signed)
 Encounter addended by: Baird Cancer, RN on: 03/13/2024 12:54 PM  Actions taken: Charge Capture section accepted

## 2024-03-13 NOTE — Progress Notes (Signed)
 BNP added on to labs. Lab called and made aware spoke to [place] in the lab.

## 2024-03-13 NOTE — Addendum Note (Signed)
 Encounter addended by: Suezanne Cheshire, RN on: 03/13/2024 11:48 AM  Actions taken: Order list changed, Diagnosis association updated, Clinical Note Signed

## 2024-03-13 NOTE — Addendum Note (Signed)
 Encounter addended by: Faythe Casa, CMA on: 03/13/2024 10:42 AM  Actions taken: Pend clinical note

## 2024-03-13 NOTE — Addendum Note (Signed)
 Encounter addended by: Suezanne Cheshire, RN on: 03/13/2024 9:59 AM  Actions taken: Pharmacy for encounter modified, Order list changed

## 2024-03-13 NOTE — Patient Instructions (Addendum)
 Good to see you today! Start Albuterol inhaler as needed follow up with primary care RESTART Jardiance 10 mg daily RESTART Entresto 97/103 mg Twice da CHEck b/p  goal 120/80  Complete sleep study  Your physician has requested that you have a cardiac MRI. Cardiac MRI uses a computer to create images of your heart as its beating, producing both still and moving pictures of your heart and major blood vessels. For further information please visit [email or web address]. Please follow the instruction sheet given to you today for more information.    Labs done today, your results will be available in MyChart, we will contact you for abnormal readings.   Your physician recommends that you schedule a follow-up appointment as scheduled   If you have any questions or concerns before your next appointment please send [place] a message through [place] or call our office at [phone].    TO LEAVE A MESSAGE FOR THE NURSE SELECT OPTION 2, PLEASE LEAVE A MESSAGE INCLUDING: YOUR NAME DATE OF BIRTH CALL BACK NUMBER REASON FOR CALL**this is important as we prioritize the call backs  YOU WILL RECEIVE A CALL BACK THE SAME DAY AS LONG AS YOU CALL BEFORE 4:00 PM At the [facility], you and your health needs are our priority. As part of our continuing mission to provide you with exceptional heart care, we have created designated Provider Care Teams. These Care Teams include your primary Cardiologist (physician) and Advanced Practice Providers (APPs- Physician Assistants and Nurse Practitioners) who all work together to provide you with the care you need, when you need it.   You may see any of the following providers on your designated Care Team at your next follow up: Dr Arvilla Meres Dr Marca Ancona Dr. Dorthula Nettles Dr. Clearnce Hasten Amy Filbert Schilder, NP Robbie Lis, [place] [facility] [place], [place] Brynda Peon, NP [place] Lee, NP Clarisa Kindred, NP Karle Plumber, PharmD Enos Fling, PharmD   Please be sure to bring in all your medications bottles to every appointment.    Thank you for choosing [facility]

## 2024-03-14 ENCOUNTER — Telehealth (HOSPITAL_COMMUNITY): Payer: Self-pay

## 2024-03-14 NOTE — Telephone Encounter (Addendum)
 Pt aware, agreeable, and verbalized understanding   ----- Message from Jacklynn Ganong sent at 03/13/2024  4:32 PM EDT ----- Labs stable. WBC mildly elevated, watch for s/s infection. No changes to plan discussed today

## 2024-03-25 ENCOUNTER — Other Ambulatory Visit (HOSPITAL_COMMUNITY): Payer: Self-pay

## 2024-03-30 ENCOUNTER — Other Ambulatory Visit (HOSPITAL_COMMUNITY): Payer: Self-pay

## 2024-04-01 ENCOUNTER — Other Ambulatory Visit: Payer: Self-pay

## 2024-04-01 ENCOUNTER — Encounter: Payer: Self-pay | Admitting: Obstetrics and Gynecology

## 2024-04-01 ENCOUNTER — Other Ambulatory Visit (HOSPITAL_COMMUNITY)
Admission: RE | Admit: 2024-04-01 | Discharge: 2024-04-01 | Disposition: A | Discharge status: Home / Self Care | Source: Ambulatory Visit | Attending: Family Medicine | Admitting: Family Medicine

## 2024-04-01 ENCOUNTER — Other Ambulatory Visit: Payer: Self-pay | Admitting: Obstetrics and Gynecology

## 2024-04-01 ENCOUNTER — Ambulatory Visit: Payer: BC Managed Care – PPO | Admitting: Obstetrics and Gynecology

## 2024-04-01 VITALS — BP 147/89 | HR 97

## 2024-04-01 DIAGNOSIS — Z202 Contact with and (suspected) exposure to infections with a predominantly sexual mode of transmission: Secondary | ICD-10-CM | POA: Diagnosis not present

## 2024-04-01 DIAGNOSIS — Z1231 Encounter for screening mammogram for malignant neoplasm of breast: Secondary | ICD-10-CM

## 2024-04-01 DIAGNOSIS — Z3201 Encounter for pregnancy test, result positive: Secondary | ICD-10-CM | POA: Diagnosis not present

## 2024-04-01 DIAGNOSIS — Z3009 Encounter for other general counseling and advice on contraception: Secondary | ICD-10-CM | POA: Diagnosis not present

## 2024-04-01 DIAGNOSIS — Z3202 Encounter for pregnancy test, result negative: Secondary | ICD-10-CM

## 2024-04-01 DIAGNOSIS — Z124 Encounter for screening for malignant neoplasm of cervix: Secondary | ICD-10-CM

## 2024-04-01 DIAGNOSIS — Z113 Encounter for screening for infections with a predominantly sexual mode of transmission: Secondary | ICD-10-CM

## 2024-04-01 LAB — POCT PREGNANCY, URINE: Preg Test, Ur: NEGATIVE

## 2024-04-01 MED ORDER — PHEXXI 1.8-1-0.4 % VA GEL
1.0000 | VAGINAL | 12 refills | Status: AC | PRN
Start: 2024-04-01 — End: ?

## 2024-04-01 NOTE — Progress Notes (Signed)
 Obstetrics and Gynecology New Patient Evaluation  Appointment Date: 04/01/2024  OBGYN Clinic: [facility]   Chief Complaint: ? Positive UPT at cardiology office  History of Present Illness: Robin Dudley is a 38 y.o.  G1P0010 (LMP 3/15-20), seen for the above chief complaint. Patient has qmonth, regular periods that aren't particularly heavy or painful and lasts for around 5 days. She states she had a UPT done at her cardiologist's office about a week or two ago and it was ?positive. She states that she's not sure why she had the UPT done; she thinks possibly due to all the meds she's on. Due to ?positive result, patient referred to us .   Nothing in results tab and last note from Cards was on 4/2 and I don't see any mention of this.   Review of Systems: Pertinent items are noted in HPI.   Patient Active Problem List   Diagnosis Date Noted   Morbid obesity (HCC) 06/19/2022   Hypokalemia 06/18/2022   Acute on chronic diastolic (congestive) heart failure (HCC) 06/17/2022   Chronic systolic heart failure (HCC) 06/17/2022   Elevated d-dimer 06/17/2022   Hypoglycemia 06/17/2022   Acute CHF (HCC) 12/24/2021   Essential hypertension 12/24/2021   Elevated troponin 12/24/2021   Hypocalcemia 12/24/2021   Hyponatremia 12/24/2021   Iron deficiency anemia 12/24/2021   Leukocytosis 12/24/2021   Hypoalbuminemia 12/24/2021   Class 1 obesity 12/24/2021   Tobacco use 12/24/2021   Past Medical History:  Past Medical History:  Diagnosis Date   Hypertension 12/24/2021   Past Surgical History:  No past surgical history on file.  Past Obstetrical History:  OB History  Gravida Para Term Preterm AB Living  1    1   SAB IAB Ectopic Multiple Live Births  1        # Outcome Date GA Lbr Len/2nd Weight Sex Type Anes PTL Lv  1 SAB 2012            Past Gynecological History: As per HPI. History of Pap Smear(s): unknown She is currently using no method for  contraception.   Social History:  Social History   Socioeconomic History   Marital status: Single    Spouse name: Not on file   Number of children: Not on file   Years of education: Not on file   Highest education level: Not on file  Occupational History   Not on file  Tobacco Use   Smoking status: Every Day    Current packs/day: 0.50    Types: Cigarettes   Smokeless tobacco: Not on file  Vaping Use   Vaping status: Never Used  Substance and Sexual Activity   Alcohol use: Yes    Comment: Social drinker now. Used to drink   Drug use: Yes    Frequency: 7.0 times per week    Types: Marijuana   Sexual activity: Yes  Other Topics Concern   Not on file  Social History Narrative   Not on file   Social Drivers of Health   Financial Resource Strain: Low Risk  (06/28/2022)   Overall Financial Resource Strain (CARDIA)    Difficulty of Paying Living Expenses: Not hard at all  Food Insecurity: No Food Insecurity (06/28/2022)   Hunger Vital Sign    Worried About Running Out of Food in the Last Year: Never true    Ran Out of Food in the Last Year: Never true  Transportation Needs: No Transportation Needs (06/28/2022)   PRAPARE - Transportation  Lack of Transportation (Medical): No    Lack of Transportation (Non-Medical): No  Physical Activity: Sufficiently Active (06/28/2022)   Exercise Vital Sign    Days of Exercise per Week: 7 days    Minutes of Exercise per Session: 60 min  Stress: No Stress Concern Present (06/28/2022)   [employer] of Occupational Health - Occupational Stress Questionnaire    Feeling of Stress : Not at all  Social Connections: Socially Isolated (06/28/2022)   Social Connection and Isolation Panel [NHANES]    Frequency of Communication with Friends and Family: Never    Frequency of Social Gatherings with Friends and Family: Never    Attends Religious Services: Never    Database administrator or Organizations: No    Attends Hospital doctor: Never    Marital Status: Living with partner  Intimate Partner Violence: Not on file    Family History:  Family History  Problem Relation Age of Onset   Diabetes Mother    Hyperlipidemia Mother    Heart failure Maternal Aunt    Heart failure Maternal Grandmother    Medications Kately Graffam had no medications administered during this visit. Current Outpatient Medications  Medication Sig Dispense Refill   acetaminophen  (TYLENOL ) 650 MG CR tablet Take 1,300 mg by mouth daily as needed for pain (leg pain).     albuterol  (VENTOLIN  HFA) 108 (90 Base) MCG/ACT inhaler Inhale 2 puffs into the lungs every 6 (six) hours as needed for wheezing or shortness of breath. 6.7 g 2   carvedilol  (COREG ) 12.5 MG tablet Take 1 tablet (12.5 mg total) by mouth 2 (two) times daily with a meal. NEEDS FOLLOW UP APPOINTMENT FOR MORE REFILLS 180 tablet 1   empagliflozin  (JARDIANCE ) 10 MG TABS tablet Take 1 tablet (10 mg total) by mouth daily before breakfast. 30 tablet 11   empagliflozin  (JARDIANCE ) 10 MG TABS tablet Take 1 tablet (10 mg total) by mouth daily before breakfast. 90 tablet 1   furosemide  (LASIX ) 40 MG tablet Take 1 tablet (40 mg total) by mouth daily. NEEDS FOLLOW UP APPOINTMENT FOR MORE REFILLS 90 tablet 2   hydrALAZINE  (APRESOLINE ) 25 MG tablet Take 1 tablet (25 mg total) by mouth 2 (two) times daily. NEEDS FOLLOW UP APPOINTMENT FOR MORE REFILLS 180 tablet 0   isosorbide  mononitrate (IMDUR ) 30 MG 24 hr tablet Take 1 tablet (30 mg total) by mouth daily. NEEDS FOLLOW UP APPOINTMENT FOR MORE REFILLS 90 tablet 0   Lactic Ac-Citric Ac-Pot Bitart (PHEXXI ) 1.8-1-0.4 % GEL Place 1 Applicatorful vaginally as needed. 5 g 12   potassium chloride  SA (KLOR-CON  M20) 20 MEQ tablet Take 1 tablet by mouth daily. 90 tablet 3   sacubitril -valsartan  (ENTRESTO ) 97-103 MG Take 1 tablet by mouth 2 (two) times daily. 90 tablet 1   spironolactone  (ALDACTONE ) 25 MG tablet Take 1 tablet (25 mg total) by mouth daily.  NEEDS FOLLOW UP APPOINTMENT FOR MORE REFILLS 90 tablet 0   sacubitril -valsartan  (ENTRESTO ) 97-103 MG Take 1 tablet by mouth 2 (two) times daily. (Patient not taking: Reported on 03/13/2024) 60 tablet 11   No current facility-administered medications for this visit.    Allergies Patient has no known allergies.   Physical Exam:  BP (!) 147/89   Pulse 97  There is no height or weight on file to calculate BMI. General appearance: Well nourished, well developed female in no acute distress.  Respiratory:  Normal respiratory effort Neuro/Psych:  Normal mood and affect.  Skin:  Warm and dry.  Pelvic and breast exam: declined  Laboratory: UPT negative  Assessment: patient stable  Plan:  1. STD exposure (Primary) Self swab done today - RPR+HBsAg+HCVAb+... - Cervicovaginal ancillary only - Beta hCG quant (ref lab)  2. Breast cancer screening by mammogram Patient amenable to mammogram screening - MM 3D SCREENING MAMMOGRAM BILATERAL BREAST; Future  3. Pregnancy examination or test, negative result  4. Cervical cancer screening Prefers female provider. 3-4wk annual exam with female provider requested  5. Contraception management.  D/w her re: birth control. I told her that as long as she's having a period she's ovulating which means she can get pregnant, which I told her I do not recommend. Options d/w her, and I told her I'd recommend paragard. Next would be either nexplanon or a progestin only pill; condoms, phexxi  and vasectomy clinic also d/w her. Pt to consider options and would like to do phexxi  in the interim. Patient to let us  know if a PA is needed, etc for the phexxi  Rx.   Orders Placed This Encounter  Procedures   MM 3D SCREENING MAMMOGRAM BILATERAL BREAST   RPR+HBsAg+HCVAb+...   Beta hCG quant (ref lab)   Pregnancy, urine POC    RTC 3-4wks for pap, annual and f/u contraception with female provider.  Future Appointments  Date Time Provider Department Center  04/03/2024   8:30 AM [facility] PA/NP [facility] None  04/18/2024  8:00 AM [facility] MM 3 [facility]-BCGMM GI-BREAST CE  05/08/2024  4:00 PM [facility]-MR 1 [facility]    Tyler Gallant MD Attending Center for [facility])

## 2024-04-02 ENCOUNTER — Telehealth (HOSPITAL_COMMUNITY): Payer: Self-pay

## 2024-04-02 LAB — RPR+HBSAG+HCVAB+...
HIV Screen 4th Generation wRfx: NONREACTIVE
Hep C Virus Ab: NONREACTIVE
Hepatitis B Surface Ag: NEGATIVE
RPR Ser Ql: NONREACTIVE

## 2024-04-02 LAB — BETA HCG QUANT (REF LAB): hCG Quant: <1 m[IU]/mL

## 2024-04-02 NOTE — Telephone Encounter (Signed)
 Called to confirm/remind patient of their appointment at the [facility] on 04/03/24.   Appointment:   [] Confirmed  [] Left mess   [x] No answer/No voice mail  [] VM Full/unable to leave message  [] Phone not in service

## 2024-04-03 ENCOUNTER — Encounter (HOSPITAL_COMMUNITY): Payer: Self-pay

## 2024-04-03 ENCOUNTER — Encounter (HOSPITAL_COMMUNITY)

## 2024-04-03 LAB — CERVICOVAGINAL ANCILLARY ONLY
Bacterial Vaginitis (gardnerella): POSITIVE — AB
Candida Glabrata: NEGATIVE
Candida Vaginitis: POSITIVE — AB
Chlamydia: NEGATIVE
Comment: NEGATIVE
Comment: NEGATIVE
Comment: NEGATIVE
Comment: NEGATIVE
Comment: NEGATIVE
Comment: NORMAL
Neisseria Gonorrhea: NEGATIVE
Trichomonas: POSITIVE — AB

## 2024-04-06 ENCOUNTER — Encounter: Payer: Self-pay | Admitting: Obstetrics and Gynecology

## 2024-04-06 MED ORDER — METRONIDAZOLE 500 MG PO TABS: 500.0000 mg | ORAL_TABLET | Freq: Two times a day (BID) | ORAL | 0 refills | Status: AC

## 2024-04-06 MED ORDER — FLUCONAZOLE 150 MG PO TABS: 150.0000 mg | ORAL_TABLET | Freq: Once | ORAL | 0 refills | Status: AC

## 2024-04-06 NOTE — Addendum Note (Signed)
 Addended by: Abdoul Encinas on: 04/06/2024 10:58 PM   Modules accepted: Orders

## 2024-04-18 ENCOUNTER — Ambulatory Visit

## 2024-04-30 ENCOUNTER — Ambulatory Visit: Admitting: Obstetrics and Gynecology

## 2024-05-07 ENCOUNTER — Encounter (HOSPITAL_COMMUNITY): Payer: Self-pay

## 2024-05-08 ENCOUNTER — Other Ambulatory Visit (HOSPITAL_COMMUNITY): Payer: Self-pay | Admitting: Family Medicine

## 2024-05-08 ENCOUNTER — Ambulatory Visit (HOSPITAL_COMMUNITY)
Admission: RE | Admit: 2024-05-08 | Discharge: 2024-05-08 | Disposition: A | Discharge status: Home / Self Care | Source: Ambulatory Visit | Attending: Family Medicine | Admitting: Family Medicine

## 2024-05-08 DIAGNOSIS — I5022 Chronic systolic (congestive) heart failure: Secondary | ICD-10-CM | POA: Insufficient documentation

## 2024-05-08 MED ORDER — GADOBUTROL 1 MMOL/ML IV SOLN
10.0000 mL | Freq: Once | INTRAVENOUS | Status: AC | PRN
  Administered 2024-05-08: 10 mL via INTRAVENOUS

## 2024-05-09 ENCOUNTER — Ambulatory Visit (HOSPITAL_COMMUNITY): Payer: Self-pay | Admitting: Family Medicine

## 2024-05-24 ENCOUNTER — Emergency Department (HOSPITAL_COMMUNITY)
Admission: EM | Admit: 2024-05-24 | Discharge: 2024-05-24 | Disposition: A | Discharge status: Home / Self Care | Attending: Emergency Medicine | Admitting: Emergency Medicine

## 2024-05-24 ENCOUNTER — Other Ambulatory Visit: Payer: Self-pay

## 2024-05-24 ENCOUNTER — Encounter (HOSPITAL_COMMUNITY): Payer: Self-pay | Admitting: *Deleted

## 2024-05-24 DIAGNOSIS — I509 Heart failure, unspecified: Secondary | ICD-10-CM | POA: Insufficient documentation

## 2024-05-24 DIAGNOSIS — I110 Hypertensive heart disease with heart failure: Secondary | ICD-10-CM | POA: Insufficient documentation

## 2024-05-24 DIAGNOSIS — R2231 Localized swelling, mass and lump, right upper limb: Secondary | ICD-10-CM | POA: Diagnosis not present

## 2024-05-24 DIAGNOSIS — L02411 Cutaneous abscess of right axilla: Secondary | ICD-10-CM | POA: Diagnosis not present

## 2024-05-24 HISTORY — DX: Heart failure, unspecified: I50.9

## 2024-05-24 MED ORDER — LIDOCAINE-EPINEPHRINE (PF) 2 %-1:200000 IJ SOLN
10.0000 mL | Freq: Once | INTRAMUSCULAR | Status: AC
  Administered 2024-05-24: 10 mL
  Filled 2024-05-24: qty 20

## 2024-05-24 MED ORDER — DOXYCYCLINE HYCLATE 100 MG PO TABS
100.0000 mg | ORAL_TABLET | Freq: Once | ORAL | Status: AC
  Administered 2024-05-24: 100 mg via ORAL
  Filled 2024-05-24: qty 1

## 2024-05-24 MED ORDER — DOXYCYCLINE HYCLATE 100 MG PO CAPS: 100.0000 mg | ORAL_CAPSULE | Freq: Two times a day (BID) | ORAL | 0 refills | Status: AC

## 2024-05-24 NOTE — ED Provider Notes (Signed)
 Emergency Department Provider Note   I have reviewed the triage vital signs and the nursing notes.   HISTORY  Chief Complaint Abscess   HPI Robin Dudley is a 38 y.o. female with PMH reviewed presents to the ED with pain and swelling to the right axilla. She has had abscess in this location before requiring I&D and reports this feels the same. No fever. She is typically able to prevent these by using spray deodorant and choosing her soaps carefully but 1 week ago she started developing pain and swelling. She initially had some drainage and thought she may have caught it early but then pain and swelling and worsened over the last few days. No symptoms in the left axilla or groin.    Past Medical History:  Diagnosis Date   CHF (congestive heart failure) (HCC)    Hypertension 12/24/2021   Trichomoniasis     Review of Systems  Constitutional: No fever/chills Cardiovascular: Denies chest pain. Respiratory: Denies shortness of breath. Skin: Right axillary abscess.   ____________________________________________   PHYSICAL EXAM:  VITAL SIGNS: ED Triage Vitals  Encounter Vitals Group     BP 05/24/24 0415 (!) 204/126     Pulse Rate 05/24/24 0415 (!) 105     Resp 05/24/24 0415 16     Temp 05/24/24 0415 98.8 F (37.1 C)     Temp src --      SpO2 05/24/24 0415 92 %     Weight 05/24/24 0416 210 lb (95.3 kg)     Height 05/24/24 0416 5' 5 (1.651 m)   Constitutional: Alert and oriented. Well appearing and in no acute distress. Eyes: Conjunctivae are normal.  Head: Atraumatic. Nose: No congestion/rhinnorhea. Mouth/Throat: Mucous membranes are moist.  Neck: No stridor.  Cardiovascular: Good peripheral circulation.  Respiratory: Normal respiratory effort.  Gastrointestinal: No distention.  Musculoskeletal: No gross deformities of extremities. Neurologic:  Normal speech and language.  Skin:  Skin is warm, dry. 4 x 5 cm area of tenderness and induration to the right axilla.  No cellulitis.    ____________________________________________   PROCEDURES  Procedure(s) performed:   .Incision and Drainage  Date/Time: 05/24/2024 4:57 AM  Performed by: Roberts Ching, MD Authorized by: Roberts Ching, MD   Consent:    Consent obtained:  Verbal   Consent given by:  Patient   Risks, benefits, and alternatives were discussed: yes     Risks discussed:  Bleeding, damage to other organs, incomplete drainage, infection and pain   Alternatives discussed:  No treatment Universal protocol:    Patient identity confirmed:  Verbally with patient Location:    Type:  Abscess   Size:  5 cm   Location:  Upper extremity   Upper extremity location:  Arm   Arm location:  R upper arm (right axilla) Pre-procedure details:    Skin preparation:  Povidone-iodine Sedation:    Sedation type:  None Anesthesia:    Anesthesia method:  Local infiltration   Local anesthetic:  Lidocaine  1% WITH epi Procedure type:    Complexity:  Complex Procedure details:    Ultrasound guidance: yes     Incision types:  Single straight   Incision depth:  Subcutaneous   Wound management:  Probed and deloculated   Drainage:  Purulent   Drainage amount:  Copious   Wound treatment:  Wound left open   Packing materials:  1/4 in iodoform gauze   Amount 1/4 iodoform:  15 cm Post-procedure details:    Procedure completion:  Tolerated well, no immediate complications   EMERGENCY DEPARTMENT US  SOFT TISSUE INTERPRETATION Study: Limited Soft Tissue Ultrasound  INDICATIONS: Pain and Soft tissue infection Multiple views of the body part were obtained in real-time with a multi-frequency linear probe PERFORMED BY:  Myself IMAGES ARCHIVED?: Yes SIDE:Right  BODY PART:Axilla FINDINGS: Abcess present and Cellulitis absent INTERPRETATION:  Abcess present  ____________________________________________   INITIAL IMPRESSION / ASSESSMENT AND PLAN / ED COURSE  Pertinent labs & imaging results that  were available during my care of the patient were reviewed by me and considered in my medical decision making (see chart for details).   This patient is Presenting for Evaluation of abscess, which does require a range of treatment options, and is a complaint that involves a moderate risk of morbidity and mortality.  The Differential Diagnoses include abscess, cellulitis, hydradenitis, sepsis, etc.  Critical Interventions-    Medications  lidocaine -EPINEPHrine (XYLOCAINE  W/EPI) 2 %-1:200000 (PF) injection 10 mL (10 mLs Infiltration Given 05/24/24 0453)  doxycycline  (VIBRA -TABS) tablet 100 mg (100 mg Oral Given 05/24/24 0451)    Reassessment after intervention: pain improved.   Medical Decision Making: Summary:  Patient presents to the ED with right axillary abscess. US  shows large fluid pocket. After obtaining verbal consent, I performed a bedside I&D with expression of copious, purulent material. Packing placed as above. Plan for Doxy at home and wound mgmt with strict ED return precautions.    Patient's presentation is most consistent with acute, uncomplicated illness.   Disposition: discharge  ____________________________________________  FINAL CLINICAL IMPRESSION(S) / ED DIAGNOSES  Final diagnoses:  Abscess of axilla, right     NEW OUTPATIENT MEDICATIONS STARTED DURING THIS VISIT:  New Prescriptions   DOXYCYCLINE  (VIBRAMYCIN ) 100 MG CAPSULE    Take 1 capsule (100 mg total) by mouth 2 (two) times daily for 7 days.    Note:  This document was prepared using Dragon voice recognition software and may include unintentional dictation errors.  Abby Hocking, MD, [facility]    Caleen Taaffe, Shereen Dike, MD 05/24/24 0500

## 2024-05-24 NOTE — ED Triage Notes (Signed)
 C/o abscess under right arm x 1 week states it is getting worse.

## 2024-05-24 NOTE — Discharge Instructions (Signed)
You have been seen in the Emergency Department (ED) today for an abscess.  This was drained in the ED.  Please follow up with your doctor or in the ED in 24-48 hours for recheck of your wound.  Read through the additional discharge instructions included below regarding wound care recommendations.  Keep the wound clean and dry, though you may wash as you would normally.  Change the dressing twice daily.  Call your doctor sooner or return to the ED if you develop worsening signs of infection such as: increased redness, increased pain, pus, or fever.      

## 2024-10-21 ENCOUNTER — Other Ambulatory Visit: Payer: Self-pay

## 2024-10-21 ENCOUNTER — Other Ambulatory Visit (HOSPITAL_COMMUNITY): Payer: Self-pay | Admitting: Family Medicine

## 2024-10-21 ENCOUNTER — Other Ambulatory Visit (HOSPITAL_COMMUNITY): Payer: Self-pay

## 2024-10-21 MED ORDER — SACUBITRIL-VALSARTAN 97-103 MG PO TABS
1.0000 | ORAL_TABLET | Freq: Two times a day (BID) | ORAL | 2 refills | Status: AC
  Filled 2024-10-21: qty 60, 30d supply, fill #0
  Filled 2024-11-16: qty 60, 30d supply, fill #1
  Filled 2024-12-16: qty 60, 30d supply, fill #2

## 2024-12-14 ENCOUNTER — Other Ambulatory Visit (HOSPITAL_COMMUNITY): Payer: Self-pay | Admitting: Family Medicine

## 2024-12-14 ENCOUNTER — Other Ambulatory Visit (HOSPITAL_COMMUNITY): Payer: Self-pay | Admitting: Internal Medicine

## 2024-12-16 ENCOUNTER — Other Ambulatory Visit (HOSPITAL_COMMUNITY): Payer: Self-pay

## 2024-12-17 ENCOUNTER — Other Ambulatory Visit (HOSPITAL_COMMUNITY): Payer: Self-pay

## 2024-12-18 ENCOUNTER — Other Ambulatory Visit (HOSPITAL_COMMUNITY): Payer: Self-pay

## 2024-12-18 MED ORDER — ISOSORBIDE MONONITRATE ER 30 MG PO TB24: 30.0000 mg | ORAL_TABLET | Freq: Every day | ORAL | 0 refills | Status: AC

## 2024-12-18 MED ORDER — HYDRALAZINE HCL 25 MG PO TABS: 25.0000 mg | ORAL_TABLET | Freq: Two times a day (BID) | ORAL | 0 refills | Status: AC

## 2024-12-18 MED ORDER — SPIRONOLACTONE 25 MG PO TABS: 25.0000 mg | ORAL_TABLET | Freq: Every day | ORAL | 0 refills | Status: AC

## 2024-12-26 ENCOUNTER — Other Ambulatory Visit (HOSPITAL_COMMUNITY): Payer: Self-pay

## 2025-01-17 ENCOUNTER — Other Ambulatory Visit: Payer: Self-pay | Admitting: Medical Genetics
